# Patient Record
Sex: Female | Born: 1995 | Race: White | Hispanic: No | Marital: Single | Smoking: Never smoker
Health system: Southern US, Community
[De-identification: ages and names within clinical notes are randomized; demographics above are authoritative.]

## PROBLEM LIST (undated history)

## (undated) DIAGNOSIS — M25851 Other specified joint disorders, right hip: Secondary | ICD-10-CM

## (undated) DIAGNOSIS — M25551 Pain in right hip: Secondary | ICD-10-CM

## (undated) DIAGNOSIS — S73191A Other sprain of right hip, initial encounter: Secondary | ICD-10-CM

## (undated) DIAGNOSIS — M24152 Other articular cartilage disorders, left hip: Secondary | ICD-10-CM

## (undated) DIAGNOSIS — M545 Low back pain, unspecified: Secondary | ICD-10-CM

## (undated) MED ORDER — BROMPHENIRAMINE-PSEUDOEPHEDRINE-DM 2 MG-30 MG-10 MG/5 ML SYRUP
2-30-10 mg/5 mL | Freq: Four times a day (QID) | ORAL | Status: AC | PRN
Start: ? — End: 2013-06-26

## (undated) MED ORDER — FLUTICASONE 50 MCG/ACTUATION NASAL SPRAY, SUSP
50 mcg/actuation | NASAL | Status: DC
Start: ? — End: 2014-12-27

---
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
## 2014-12-27 ENCOUNTER — Ambulatory Visit
Admit: 2014-12-27 | Discharge: 2014-12-27 | Payer: PRIVATE HEALTH INSURANCE | Attending: Specialist | Primary: Specialist

## 2014-12-27 DIAGNOSIS — Z0000 Encounter for general adult medical examination without abnormal findings: Secondary | ICD-10-CM

## 2014-12-27 NOTE — Other (Signed)
Patient verified name, DOB, and surgery as listed in [facility].    TYPE  CASE:3            Orders:none as of yet  Labs per surgeon:  Pending orders   Labs per anesthesia protocol: cbc, bmp-- pt having these drawn 12/29/14 at dr patels office--- results will be in [facility]--- will also need nasal swab--- pt coming 12/28/14 for this--- also will need hcg dos  EKG  :  none    Instructed patient to continue previous medications as prescribed prior to surgery and  to take the following medications the day of surgery according to anesthesia guidelines with a small sip of water : none       Continue all previous medications unless otherwise directed.     Instructed patient to hold  the following medications prior to surgery: none    Patient instructed on the following and verbalized understanding:  Arrive at opc entrance, time of arrival to be called the day before by 1700.  Responsible adult must drive patient to and from hospital, stay during surgery and 24 hours postoperatively.  Npo after midnight including gum, mints and ice chips.  Use hibiclens in the shower the night before and the morning of surgery.OF NOTE---- SURG OVER 4 HOURS---- listed as phone assess--- spoke with dr crumpler--ok to see pt dos  Leave all valuables at home. Instructed on no jewelry or body piercings on the dos.  Bring insurance card and ID.  No perfumes, oil, powder, colognes, makeup or  lotions on the skin.    Patient verbalized understanding of all instructions and provided all medical/health information to the best of their ability.

## 2014-12-27 NOTE — Progress Notes (Signed)
[facility]  Liisa Picone C. Posey Pronto, M.D.  Campbell Riches, M.D.  [place], [place]  Ph No:  [phone]  Fax:  [phone]      CHIEF COMPLAINT: complete physical       History of Present Illness: Ms. Silvernail is a 19 y.o. female that presents today with PMH of   Pertinent past medical history she is in office today stating that she is here for complete physical.    No Known Allergies    Past Medical History   Diagnosis Date   ??? Left hip pain        History reviewed. No pertinent past surgical history.    Family History   Problem Relation Age of Onset   ??? Elevated Lipids Father    ??? Hypertension Mother        History     Social History   ??? Marital Status: SINGLE     Spouse Name: N/A   ??? Number of Children: N/A   ??? Years of Education: N/A     Occupational History   ??? Not on file.     Social History Main Topics   ??? Smoking status: Never Smoker    ??? Smokeless tobacco: Never Used   ??? Alcohol Use: No   ??? Drug Use: No   ??? Sexual Activity: Not on file     Other Topics Concern   ??? Not on file     Social History Narrative       Current Outpatient Prescriptions   Medication Sig Dispense Refill   ??? norethindrone ac-eth estradiol (LOESTRIN 1.5/30, 21,) 1.5-30 mg-mcg tab Take 1 Tab by mouth nightly.         IMMUNIZATIONS:  Immunization History   Administered Date(s) Administered   ??? Tdap 12/27/2014         REVIEW OF SYSTEMS:    GENERAL/CONSTITUTIONAL: Negative for  - chills, fatigue, fever, night sweats, sleep disturbance, weight gain, weight loss  HEAD, EYES, EARS, NOSE AND THROAT: Negative for - epistaxis, headaches, hearing change, nasal congestion, nasal discharge, oral lesions, sinus pain, sneezing, sore throat, tinnitus, vertigo, visual changes, vocal changes  CARDIOVASCULAR: Negative for - chest pain, edema, irregular heartbeat, loss of consciousness, orthopnea, palpitations, paroxysmal nocturnal dyspnea, rapid heart rate, shortness of breath on exertion.   RESPIRATORY:  Negative for - cough, hemoptysis, orthopnea, pleuritic pain, shortness of breath, sputum changes,  tachypnea, wheezing  GASTROINTESTINAL: Negative for - abdominal pain, appetite loss, blood in stools, change in bowel habits, change in stools, constipation, diarrhea, gas/bloating, heartburn, hematemesis, melena, nausea/vomiting, stool incontinence, swallowing difficulty/pain  GENITOURINARY: Negative for - Urinary frequency  MUSCULOSKELETAL: Negative for - gait disturbance, joint pain, joint stiffness, joint swelling, muscle pain and muscular weakness  SKIN:  Negative for - acne, dry skin, eczema, hair changes, lumps, mole changes, nail changes, pruritus, rash, skin lesion changes  NEUROLOGIC: Negative for - behavioral changes, bowel and bladder control changes, confusion, dizziness, gait disturbance, headaches, impaired coordination/balance, memory loss, numbness/tingling, seizures, speech problems, tremors, visual changes, weakness  PSYCHIATRIC: Negative for - anxiety, behavioral disorder, concentration difficulties, depression, disorientation, hallucinations, irritability,  mood swings, obsessive thoughts,  sleep disturbances, suicidal ideation  ENDOCRINE:  Negative for - breast changes, galactorrhea, hair pattern changes, hot flashes, malaise/lethargy, mood swings, palpitations, polydipsia/polyuria, skin changes, temperature intolerance, unexpected weight changes  HEMATOLOGIC/LYMPHATIC:  Negative for - bleeding problems, blood clots, blood transfusions, bruising, fatigue, jaundice, night sweats, pallor, swollen lymph  nodes, weight loss   ALLERGIC/IMMUNOLOGIC:  Negative for - hives, insect bite sensitivity, itchy/watery eyes, nasal congestion, postnasal drip, seasonal allergies        PHYSICAL EXAM   Vital Signs - BP 112/74 mmHg   Pulse 62   Ht '5\' 7"'  (1.702 m)   Wt 133 lb (60.328 kg)   BMI 20.83 kg/m2   Constitutional - alert, well appearing, and in no distress.   Eyes - pupils equal and reactive, extraocular eye movements intact.  Ear, Nose, Mouth, Throat - external inspection of ears and nose normal   Neck - supple, no significant adenopathy.   Respiratory - clear to auscultation, no wheezes, rales or bronchi, symmetric air entry.  Cardiovascular - normal rate, regular rhythm, normal S1, S2  no murmurs, rubs, clicks or gallops.  Gastrointestinal - Abdomen soft, nontender, nondistended, no masses.  Back exam -  Normal range of motion.  Musculoskeletal - no joint tenderness  Skin - normal coloration and turgor, no rashes, no suspicious skin lesions noted.  Neurological -  Motor and sensory exam is intact .   Extremities - nno edema  Psychiatric - alert, oriented to person, place, and time, recent and remote memory, mood and affect       LABS:     Results for orders placed or performed in visit on 04/19/24   METABOLIC PANEL, COMPREHENSIVE   Result Value Ref Range    Glucose 73 65 - 99 mg/dL    BUN 8 6 - 20 mg/dL    Creatinine 0.70 0.57 - 1.00 mg/dL    GFR est non-AA 127 >59 mL/min/1.73    GFR est AA 146 >59 mL/min/1.73    BUN/Creatinine ratio 11 8 - 20    Sodium 141 134 - 144 mmol/L    Potassium 4.2 3.5 - 5.2 mmol/L    Chloride 100 97 - 108 mmol/L    CO2 24 18 - 29 mmol/L    Calcium 9.8 8.7 - 10.2 mg/dL    Protein, total 6.8 6.0 - 8.5 g/dL    Albumin 4.5 3.5 - 5.5 g/dL    GLOBULIN, TOTAL 2.3 1.5 - 4.5 g/dL    A-G Ratio 2.0 1.1 - 2.5    Bilirubin, total 0.2 0.0 - 1.2 mg/dL    Alk. phosphatase 89 43 - 101 IU/L    AST 31 0 - 40 IU/L    ALT 30 0 - 32 IU/L   INFECTIOUS MONONUCLEOSIS, QL   Result Value Ref Range    Mononucleosis Test, Ql. Negative Negative   AMB POC COMPLETE CBC,AUTOMATED ENTER   Result Value Ref Range    WBC (POC) 6.5 4.5 - 10.5 10^3/ul    LYMPHOCYTES (POC) 25.7 20.5 - 51.1 %    MONOCYTES (POC) 7.7 1.7 - 9.3 %    GRANULOCYTES (POC) 66.6 42.2 - 75.2 %    ABS. LYMPHS (POC) 1.7 1.2 - 3.4 10^3/ul    ABS. MONOS (POC) 0.5 0.1 - 0.6 10^3/ul     ABS. GRANS (POC) 4.3 1.4 - 6.5 10^3/ul    RBC (POC) 4.81 4 - 6 10^6/ul    HGB (POC) 13.8 11 - 18 g/dL    HCT (POC) 41.7 35 - 60 %    MCV (POC) 86.6 80 - 99.9 fL    MCH (POC) 28.8 27 - 31 pg    MCHC (POC) 33.2 33 - 37 g/dL    RDW (POC) 13.2 11.6 - 13.7 %    PLATELET (POC) 211 150 -  450 10^3/ul    MPV (POC) 7.3 (A) 7.8 - 11 fL   AMB POC RAPID STREP A   Result Value Ref Range    VALID INTERNAL CONTROL POC Yes     Group A Strep Ag Negative Negative       IMPRESSION:     ICD-10-CM ICD-9-CM    1. Physical exam, annual Z00.00 V70.0 AMB POC COMPLETE CBC,AUTOMATED ENTER      AMB POC URINALYSIS DIP STICK AUTO W/O MICRO (CIM)      COLLECTION VENOUS BLOOD,VENIPUNCTURE      AMB POC HEMOGLOBIN A1C      LIPID PANEL      METABOLIC PANEL, COMPREHENSIVE      TSH 3RD GENERATION      VITAMIN D, 1, 25 DIHYDROXY   2. Need for Tdap vaccination Z23 V06.1 TETANUS, DIPHTHERIA TOXOIDS AND ACELLULAR PERTUSSIS VACCINE (TDAP), IN INDIVIDS. >=7, IM      PR IMMUNIZ ADMIN,1 SINGLE/COMB VAC/TOXOID        PLAN: I discussed with patient and reviewed the findings.  She is given Tdp Booster.  She is advised to blood drawn today.  She is advised to visit diet.  Continue exercise program.  She had her GYN checkup completed. Follow-up near future.                 Zenovia Jarred, MD    Dictated using voice recognition software. Proofread, but unrecognized voice recognition errors may exist.

## 2014-12-28 ENCOUNTER — Inpatient Hospital Stay: Admit: 2014-12-28 | Payer: PRIVATE HEALTH INSURANCE | Primary: Specialist

## 2014-12-28 ENCOUNTER — Inpatient Hospital Stay: Primary: Specialist

## 2014-12-28 DIAGNOSIS — Z01812 Encounter for preprocedural laboratory examination: Secondary | ICD-10-CM

## 2014-12-28 LAB — MSSA/MRSA SC BY PCR, NASAL SWAB

## 2014-12-28 NOTE — Other (Signed)
Patient here for nasal swab, patient to have lab work with Dr Allena KatzPatel tomorrow.

## 2014-12-28 NOTE — Other (Signed)
MRSA/SA nasal swab results are both are negative.

## 2014-12-28 NOTE — Other (Signed)
Received faxed surgeon orders.  Faxed to pharmacy and placed on chart.

## 2014-12-29 ENCOUNTER — Encounter: Admit: 2014-12-29 | Discharge: 2014-12-29 | Payer: PRIVATE HEALTH INSURANCE | Primary: Specialist

## 2014-12-29 DIAGNOSIS — Z0000 Encounter for general adult medical examination without abnormal findings: Secondary | ICD-10-CM

## 2014-12-29 LAB — AMB POC COMPLETE CBC,AUTOMATED ENTER
ABS. GRANS (POC): 7.7 10*3/uL — AB (ref 1.4–6.5)
ABS. LYMPHS (POC): 1.2 10*3/uL (ref 1.2–3.4)
ABS. MONOS (POC): 0.4 10*3/uL (ref 0.1–0.6)
GRANULOCYTES (POC): 82.9 % — AB (ref 42.2–75.2)
HCT (POC): 43.8 % (ref 35–60)
HGB (POC): 14.2 g/dL (ref 11–18)
LYMPHOCYTES (POC): 13.1 % — AB (ref 20.5–51.1)
MCH (POC): 28.7 pg (ref 27–31)
MCHC (POC): 32.3 g/dL — AB (ref 33–37)
MCV (POC): 88.7 fL (ref 80–99.9)
MONOCYTES (POC): 4 % (ref 1.7–9.3)
MPV (POC): 7.9 fL (ref 7.8–11)
PLATELET (POC): 166 10*3/uL (ref 150–450)
RBC (POC): 4.94 10*6/uL (ref 4–6)
RDW (POC): 13.2 % (ref 11.6–13.7)
WBC (POC): 9.3 10*3/uL (ref 4.5–10.5)

## 2014-12-29 LAB — AMB POC URINALYSIS DIP STICK AUTO W/O MICRO
Bilirubin (UA POC): NEGATIVE
Blood (UA POC): NEGATIVE
Glucose (UA POC): NEGATIVE mg/dL
Ketones (UA POC): NEGATIVE
Nitrites (UA POC): NEGATIVE
Protein (UA POC): NEGATIVE
Specific gravity (UA POC): 1.025 (ref 1.001–1.035)
Urobilinogen (UA POC): 0.2 (ref 0.2–1)
pH (UA POC): 5.5 (ref 4.6–8.0)

## 2014-12-29 LAB — AMB POC HEMOGLOBIN A1C: Hemoglobin A1c (POC): 5.3 % (ref 4.6–6.2)

## 2014-12-30 LAB — METABOLIC PANEL, COMPREHENSIVE
A-G Ratio: 2.1 (ref 1.1–2.5)
ALT (SGPT): 21 IU/L (ref 0–32)
AST (SGOT): 21 IU/L (ref 0–40)
Albumin: 4.7 g/dL (ref 3.5–5.5)
Alk. phosphatase: 74 IU/L (ref 39–117)
BUN/Creatinine ratio: 13 (ref 8–20)
BUN: 12 mg/dL (ref 6–20)
Bilirubin, total: 0.4 mg/dL (ref 0.0–1.2)
CO2: 24 mmol/L (ref 18–29)
Calcium: 9.5 mg/dL (ref 8.7–10.2)
Chloride: 102 mmol/L (ref 97–108)
Creatinine: 0.89 mg/dL (ref 0.57–1.00)
GFR est AA: 108 mL/min/{1.73_m2} (ref >59)
GFR est non-AA: 94 mL/min/{1.73_m2} (ref >59)
GLOBULIN, TOTAL: 2.2 g/dL (ref 1.5–4.5)
Glucose: 79 mg/dL (ref 65–99)
Potassium: 4.6 mmol/L (ref 3.5–5.2)
Protein, total: 6.9 g/dL (ref 6.0–8.5)
Sodium: 141 mmol/L (ref 134–144)

## 2014-12-30 LAB — LIPID PANEL
Cholesterol, total: 207 mg/dL — ABNORMAL HIGH (ref 100–199)
HDL Cholesterol: 77 mg/dL (ref >39)
LDL, calculated: 116 mg/dL — ABNORMAL HIGH (ref 0–99)
Triglyceride: 68 mg/dL (ref 0–149)
VLDL, calculated: 14 mg/dL (ref 5–40)

## 2014-12-30 LAB — TSH 3RD GENERATION: TSH: 1.7 u[IU]/mL (ref 0.450–4.500)

## 2014-12-30 LAB — VITAMIN D, 1, 25 DIHYDROXY: Calcitriol (Vit D 1, 25 di-OH): 52.6 pg/mL (ref 19.9–79.3)

## 2014-12-30 NOTE — Other (Signed)
Reviewed CMP results  -- WNL.  Placed order for T&S, CBC for DOS (per protocol).

## 2015-01-02 ENCOUNTER — Inpatient Hospital Stay: Payer: PRIVATE HEALTH INSURANCE

## 2015-01-02 ENCOUNTER — Ambulatory Visit: Admit: 2015-01-02 | Payer: PRIVATE HEALTH INSURANCE | Primary: Specialist

## 2015-01-02 LAB — TYPE AND SCREEN
ABO/Rh: O POS
Antibody Screen: NEGATIVE

## 2015-01-02 LAB — HCG URINE, QL. - POC: Pregnancy test,urine (POC): NEGATIVE

## 2015-01-02 LAB — TYPE & SCREEN
ABO/Rh(D): O POS
Antibody screen: NEGATIVE

## 2015-01-02 MED ORDER — NALOXONE 0.4 MG/ML INJECTION
0.4 mg/mL | INTRAMUSCULAR | Status: DC | PRN
Start: 2015-01-02 — End: 2015-01-03

## 2015-01-02 MED ORDER — HYDROMORPHONE (PF) 2 MG/ML IJ SOLN
2 mg/mL | INTRAMUSCULAR | Status: DC | PRN
Start: 2015-01-02 — End: 2015-01-03
  Administered 2015-01-02 (×2): via INTRAVENOUS

## 2015-01-02 MED ORDER — MIDAZOLAM 1 MG/ML IJ SOLN
1 mg/mL | Freq: Once | INTRAMUSCULAR | Status: AC
Start: 2015-01-02 — End: 2015-01-02
  Administered 2015-01-02: 11:00:00 via INTRAVENOUS

## 2015-01-02 MED ORDER — EPINEPHRINE 1 MG/ML IJ SOLN
1 mg/mL | INTRAMUSCULAR | Status: AC
Start: 2015-01-02 — End: ?

## 2015-01-02 MED ORDER — PROPOFOL 10 MG/ML IV EMUL
10 mg/mL | INTRAVENOUS | Status: AC
Start: 2015-01-02 — End: ?

## 2015-01-02 MED ORDER — CEFAZOLIN 2 GRAM/50 ML NS IVPB
Freq: Once | INTRAVENOUS | Status: AC
Start: 2015-01-02 — End: 2015-01-02
  Administered 2015-01-02: 12:00:00 via INTRAVENOUS

## 2015-01-02 MED ORDER — LIDOCAINE (PF) 20 MG/ML (2 %) IJ SOLN
20 mg/mL (2 %) | INTRAMUSCULAR | Status: DC | PRN
Start: 2015-01-02 — End: 2015-01-02
  Administered 2015-01-02: 12:00:00 via INTRAVENOUS

## 2015-01-02 MED ORDER — CEFAZOLIN 2 GRAM/50 ML NS IVPB
Freq: Three times a day (TID) | INTRAVENOUS | Status: AC
Start: 2015-01-02 — End: 2015-01-02
  Administered 2015-01-02 – 2015-01-03 (×2): via INTRAVENOUS

## 2015-01-02 MED ORDER — BUPIVACAINE-EPINEPHRINE (PF) 0.25 %-1:200,000 IJ SOLN
0.25 %-1:200,000 | INTRAMUSCULAR | Status: DC | PRN
Start: 2015-01-02 — End: 2015-01-02
  Administered 2015-01-02: 11:00:00 via PERINEURAL

## 2015-01-02 MED ORDER — ACETAMINOPHEN 325 MG TABLET
325 mg | Freq: Three times a day (TID) | ORAL | Status: DC
Start: 2015-01-02 — End: 2015-01-03
  Administered 2015-01-02 – 2015-01-03 (×3): via ORAL

## 2015-01-02 MED ORDER — LACTATED RINGERS IV
INTRAVENOUS | Status: DC
Start: 2015-01-02 — End: 2015-01-03
  Administered 2015-01-02: 17:00:00 via INTRAVENOUS

## 2015-01-02 MED ORDER — ROCURONIUM 10 MG/ML IV
10 mg/mL | INTRAVENOUS | Status: AC
Start: 2015-01-02 — End: ?

## 2015-01-02 MED ORDER — LACTATED RINGERS IV
INTRAVENOUS | Status: DC
Start: 2015-01-02 — End: 2015-01-03
  Administered 2015-01-02: 18:00:00 via INTRAVENOUS

## 2015-01-02 MED ORDER — ROPIVACAINE (PF) 5 MG/ML (0.5 %) INJECTION
5 mg/mL (0. %) | INTRAMUSCULAR | Status: AC
Start: 2015-01-02 — End: ?

## 2015-01-02 MED ORDER — ONDANSETRON (PF) 4 MG/2 ML INJECTION
4 mg/2 mL | INTRAMUSCULAR | Status: DC | PRN
Start: 2015-01-02 — End: 2015-01-02
  Administered 2015-01-02: 14:00:00 via INTRAVENOUS

## 2015-01-02 MED ORDER — SODIUM CHLORIDE 0.9 % IJ SYRG
Freq: Three times a day (TID) | INTRAMUSCULAR | Status: DC
Start: 2015-01-02 — End: 2015-01-03
  Administered 2015-01-03: 02:00:00 via INTRAVENOUS

## 2015-01-02 MED ORDER — NEOSTIGMINE METHYLSULFATE 1 MG/ML INJECTION
1 mg/mL | INTRAMUSCULAR | Status: DC | PRN
Start: 2015-01-02 — End: 2015-01-02
  Administered 2015-01-02: 14:00:00 via INTRAVENOUS

## 2015-01-02 MED ORDER — LACTATED RINGERS IV
INTRAVENOUS | Status: DC
Start: 2015-01-02 — End: 2015-01-02
  Administered 2015-01-02 (×2): via INTRAVENOUS

## 2015-01-02 MED ORDER — FENTANYL CITRATE (PF) 50 MCG/ML IJ SOLN
50 mcg/mL | INTRAMUSCULAR | Status: DC | PRN
Start: 2015-01-02 — End: 2015-01-02
  Administered 2015-01-02 (×3): via INTRAVENOUS

## 2015-01-02 MED ORDER — NAPROXEN 250 MG TAB
250 mg | Freq: Two times a day (BID) | ORAL | Status: DC
Start: 2015-01-02 — End: 2015-01-03
  Administered 2015-01-03: 12:00:00 via ORAL

## 2015-01-02 MED ORDER — HYDROMORPHONE (PF) 1 MG/ML IJ SOLN
1 mg/mL | INTRAMUSCULAR | Status: DC | PRN
Start: 2015-01-02 — End: 2015-01-03

## 2015-01-02 MED ORDER — GLYCOPYRROLATE 0.2 MG/ML IJ SOLN
0.2 mg/mL | INTRAMUSCULAR | Status: DC | PRN
Start: 2015-01-02 — End: 2015-01-02
  Administered 2015-01-02: 14:00:00 via INTRAVENOUS

## 2015-01-02 MED ORDER — CELECOXIB 200 MG CAP
200 mg | Freq: Once | ORAL | Status: AC
Start: 2015-01-02 — End: 2015-01-02
  Administered 2015-01-02: 11:00:00 via ORAL

## 2015-01-02 MED ORDER — DIPHENHYDRAMINE HCL 50 MG/ML IJ SOLN
50 mg/mL | Freq: Once | INTRAMUSCULAR | Status: DC | PRN
Start: 2015-01-02 — End: 2015-01-03

## 2015-01-02 MED ORDER — OXYCODONE 5 MG TAB
5 mg | Freq: Once | ORAL | Status: DC | PRN
Start: 2015-01-02 — End: 2015-01-03
  Administered 2015-01-02 – 2015-01-03 (×2): via ORAL

## 2015-01-02 MED ORDER — PROPOFOL 10 MG/ML IV EMUL
10 mg/mL | INTRAVENOUS | Status: DC | PRN
Start: 2015-01-02 — End: 2015-01-02
  Administered 2015-01-02: 12:00:00 via INTRAVENOUS

## 2015-01-02 MED ORDER — ALBUTEROL SULFATE 0.083 % (0.83 MG/ML) SOLN FOR INHALATION
2.5 mg /3 mL (0.083 %) | RESPIRATORY_TRACT | Status: DC | PRN
Start: 2015-01-02 — End: 2015-01-03

## 2015-01-02 MED ORDER — MIDAZOLAM 1 MG/ML IJ SOLN
1 mg/mL | Freq: Once | INTRAMUSCULAR | Status: DC | PRN
Start: 2015-01-02 — End: 2015-01-02

## 2015-01-02 MED ORDER — ONDANSETRON (PF) 4 MG/2 ML INJECTION
4 mg/2 mL | INTRAMUSCULAR | Status: AC
Start: 2015-01-02 — End: ?

## 2015-01-02 MED ORDER — ALUM-MAG HYDROXIDE-SIMETH 200 MG-200 MG-20 MG/5 ML ORAL SUSP
200-200-20 mg/5 mL | ORAL | Status: DC | PRN
Start: 2015-01-02 — End: 2015-01-03

## 2015-01-02 MED ORDER — EPINEPHRINE (PF) 1 MG/ML INJECTION
1 mg/mL ( mL) | INTRAMUSCULAR | Status: DC | PRN
Start: 2015-01-02 — End: 2015-01-02
  Administered 2015-01-02: 11:00:00

## 2015-01-02 MED ORDER — OXYCODONE-ACETAMINOPHEN 7.5 MG-325 MG TAB
ORAL | Status: DC | PRN
Start: 2015-01-02 — End: 2015-01-03
  Administered 2015-01-02 – 2015-01-03 (×4): via ORAL

## 2015-01-02 MED ORDER — FENTANYL CITRATE (PF) 50 MCG/ML IJ SOLN
50 mcg/mL | INTRAMUSCULAR | Status: AC
Start: 2015-01-02 — End: ?

## 2015-01-02 MED ORDER — NEOSTIGMINE METHYLSULFATE 1 MG/ML INJECTION
1 mg/mL | INTRAMUSCULAR | Status: AC
Start: 2015-01-02 — End: ?

## 2015-01-02 MED ORDER — PREGABALIN 150 MG CAP
150 mg | Freq: Once | ORAL | Status: AC
Start: 2015-01-02 — End: 2015-01-02
  Administered 2015-01-02: 11:00:00 via ORAL

## 2015-01-02 MED ORDER — LIDOCAINE HCL 1 % (10 MG/ML) IJ SOLN
10 mg/mL (1 %) | INTRAMUSCULAR | Status: DC | PRN
Start: 2015-01-02 — End: 2015-01-02

## 2015-01-02 MED ORDER — APREPITANT 40 MG CAP
40 mg | Freq: Once | ORAL | Status: AC
Start: 2015-01-02 — End: 2015-01-02
  Administered 2015-01-02: 11:00:00 via ORAL

## 2015-01-02 MED ORDER — SODIUM CHLORIDE 0.9 % IJ SYRG
INTRAMUSCULAR | Status: DC | PRN
Start: 2015-01-02 — End: 2015-01-03

## 2015-01-02 MED ORDER — ONDANSETRON (PF) 4 MG/2 ML INJECTION
4 mg/2 mL | INTRAMUSCULAR | Status: DC | PRN
Start: 2015-01-02 — End: 2015-01-03
  Administered 2015-01-02 – 2015-01-03 (×6): via INTRAVENOUS

## 2015-01-02 MED ORDER — GLYCOPYRROLATE 0.2 MG/ML IJ SOLN
0.2 mg/mL | INTRAMUSCULAR | Status: AC
Start: 2015-01-02 — End: ?

## 2015-01-02 MED ORDER — LIDOCAINE (PF) 20 MG/ML (2 %) IJ SOLN
20 mg/mL (2 %) | INTRAMUSCULAR | Status: AC
Start: 2015-01-02 — End: ?

## 2015-01-02 MED ORDER — ONDANSETRON (PF) 4 MG/2 ML INJECTION
4 mg/2 mL | Freq: Once | INTRAMUSCULAR | Status: AC
Start: 2015-01-02 — End: 2015-01-02

## 2015-01-02 MED ORDER — DOCUSATE SODIUM 100 MG CAP
100 mg | Freq: Two times a day (BID) | ORAL | Status: DC
Start: 2015-01-02 — End: 2015-01-03
  Administered 2015-01-02 – 2015-01-03 (×2): via ORAL

## 2015-01-02 MED ORDER — DEXAMETHASONE SODIUM PHOSPHATE 4 MG/ML IJ SOLN
4 mg/mL | INTRAMUSCULAR | Status: AC
Start: 2015-01-02 — End: ?

## 2015-01-02 MED ORDER — NORETHINDRONE ACETATE 1.5 MG-ETHINYL ESTRADIOL 30 MCG TABLET
Freq: Every day | ORAL | Status: DC
Start: 2015-01-02 — End: 2015-01-03

## 2015-01-02 MED ORDER — TAPENTADOL 50 MG TAB
50 mg | Freq: Once | ORAL | Status: AC
Start: 2015-01-02 — End: 2015-01-02
  Administered 2015-01-02: 11:00:00 via ORAL

## 2015-01-02 MED ORDER — ROPIVACAINE (PF) 5 MG/ML (0.5 %) INJECTION
5 mg/mL (0. %) | INTRAMUSCULAR | Status: DC | PRN
Start: 2015-01-02 — End: 2015-01-02
  Administered 2015-01-02: 14:00:00

## 2015-01-02 MED ORDER — DEXAMETHASONE SODIUM PHOSPHATE 4 MG/ML IJ SOLN
4 mg/mL | INTRAMUSCULAR | Status: DC | PRN
Start: 2015-01-02 — End: 2015-01-02
  Administered 2015-01-02: 12:00:00 via INTRAVENOUS

## 2015-01-02 MED ORDER — ROCURONIUM 10 MG/ML IV
10 mg/mL | INTRAVENOUS | Status: DC | PRN
Start: 2015-01-02 — End: 2015-01-02
  Administered 2015-01-02 (×3): via INTRAVENOUS

## 2015-01-02 MED ORDER — BUPIVACAINE-EPINEPHRINE (PF) 0.5 %-1:200,000 IJ SOLN
0.5 %-1:200,000 | INTRAMUSCULAR | Status: DC | PRN
Start: 2015-01-02 — End: 2015-01-02
  Administered 2015-01-02: 11:00:00 via PERINEURAL

## 2015-01-02 MED ORDER — KETOROLAC TROMETHAMINE 30 MG/ML INJECTION
30 mg/mL (1 mL) | INTRAMUSCULAR | Status: AC
Start: 2015-01-02 — End: 2015-01-02
  Administered 2015-01-02: 15:00:00 via INTRAVENOUS

## 2015-01-02 MED FILL — ROCURONIUM 10 MG/ML IV: 10 mg/mL | INTRAVENOUS | Qty: 5

## 2015-01-02 MED FILL — GLYCOPYRROLATE 0.2 MG/ML IJ SOLN: 0.2 mg/mL | INTRAMUSCULAR | Qty: 0.4

## 2015-01-02 MED FILL — DEXAMETHASONE SODIUM PHOSPHATE 4 MG/ML IJ SOLN: 4 mg/mL | INTRAMUSCULAR | Qty: 4

## 2015-01-02 MED FILL — TYLENOL 325 MG TABLET: 325 mg | ORAL | Qty: 2

## 2015-01-02 MED FILL — DOCUSATE SODIUM 100 MG CAP: 100 mg | ORAL | Qty: 1

## 2015-01-02 MED FILL — GLYCOPYRROLATE 0.2 MG/ML IJ SOLN: 0.2 mg/mL | INTRAMUSCULAR | Qty: 2

## 2015-01-02 MED FILL — CEFAZOLIN 2 GRAM/50 ML NS IVPB: INTRAVENOUS | Qty: 50

## 2015-01-02 MED FILL — EMEND 40 MG CAPSULE: 40 mg | ORAL | Qty: 1

## 2015-01-02 MED FILL — OXYCODONE 5 MG TAB: 5 mg | ORAL | Qty: 2

## 2015-01-02 MED FILL — ONDANSETRON (PF) 4 MG/2 ML INJECTION: 4 mg/2 mL | INTRAMUSCULAR | Qty: 2

## 2015-01-02 MED FILL — NUCYNTA 50 MG TABLET: 50 mg | ORAL | Qty: 2

## 2015-01-02 MED FILL — ROCURONIUM 10 MG/ML IV: 10 mg/mL | INTRAVENOUS | Qty: 65

## 2015-01-02 MED FILL — NEOSTIGMINE METHYLSULFATE 1 MG/ML INJECTION: 1 mg/mL | INTRAMUSCULAR | Qty: 3

## 2015-01-02 MED FILL — LACTATED RINGERS IV: INTRAVENOUS | Qty: 1000

## 2015-01-02 MED FILL — ONDANSETRON (PF) 4 MG/2 ML INJECTION: 4 mg/2 mL | INTRAMUSCULAR | Qty: 4

## 2015-01-02 MED FILL — LIDOCAINE (PF) 20 MG/ML (2 %) IJ SOLN: 20 mg/mL (2 %) | INTRAMUSCULAR | Qty: 100

## 2015-01-02 MED FILL — KETOROLAC TROMETHAMINE 30 MG/ML INJECTION: 30 mg/mL (1 mL) | INTRAMUSCULAR | Qty: 1

## 2015-01-02 MED FILL — NEOSTIGMINE METHYLSULFATE 1 MG/ML INJECTION: 1 mg/mL | INTRAMUSCULAR | Qty: 10

## 2015-01-02 MED FILL — MIDAZOLAM 1 MG/ML IJ SOLN: 1 mg/mL | INTRAMUSCULAR | Qty: 2

## 2015-01-02 MED FILL — NAROPIN (PF) 5 MG/ML (0.5 %) INJECTION SOLUTION: 5 mg/mL (0. %) | INTRAMUSCULAR | Qty: 30

## 2015-01-02 MED FILL — SENSORCAINE-MPF/EPINEPHRINE 0.5 %-1:200,000 INJECTION SOLUTION: 0.5 %-1:200,000 | INTRAMUSCULAR | Qty: 20

## 2015-01-02 MED FILL — LYRICA 150 MG CAPSULE: 150 mg | ORAL | Qty: 1

## 2015-01-02 MED FILL — HYDROMORPHONE (PF) 2 MG/ML IJ SOLN: 2 mg/mL | INTRAMUSCULAR | Qty: 1

## 2015-01-02 MED FILL — PROPOFOL 10 MG/ML IV EMUL: 10 mg/mL | INTRAVENOUS | Qty: 150

## 2015-01-02 MED FILL — EPINEPHRINE 1 MG/ML IJ SOLN: 1 mg/mL | INTRAMUSCULAR | Qty: 30

## 2015-01-02 MED FILL — CELECOXIB 200 MG CAP: 200 mg | ORAL | Qty: 2

## 2015-01-02 MED FILL — FENTANYL CITRATE (PF) 50 MCG/ML IJ SOLN: 50 mcg/mL | INTRAMUSCULAR | Qty: 2

## 2015-01-02 MED FILL — DEXAMETHASONE SODIUM PHOSPHATE 4 MG/ML IJ SOLN: 4 mg/mL | INTRAMUSCULAR | Qty: 1

## 2015-01-02 MED FILL — LIDOCAINE (PF) 20 MG/ML (2 %) IJ SOLN: 20 mg/mL (2 %) | INTRAMUSCULAR | Qty: 5

## 2015-01-02 MED FILL — OXYCODONE-ACETAMINOPHEN 7.5 MG-325 MG TAB: ORAL | Qty: 1

## 2015-01-02 MED FILL — PROPOFOL 10 MG/ML IV EMUL: 10 mg/mL | INTRAVENOUS | Qty: 20

## 2015-01-02 MED FILL — SENSORCAINE-MPF/EPINEPHRINE 0.25 %-1:200,000 INJECTION SOLUTION: 0.25 %-1:200,000 | INTRAMUSCULAR | Qty: 30

## 2015-01-02 NOTE — Progress Notes (Addendum)
Problem: Mobility Impaired (Adult and Pediatric)  Goal: *Acute Goals and Plan of Care (Insert Text)  Acute care goals:  (1.)Brooke Graham will move from supine to sit and sit to supine with STAND BY ASSIST within 3 day(s).   (2.)Brooke Graham will transfer from bed to chair and chair to bed with CONTACT GUARD ASSIST using the least restrictive device within 3 day(s).   (3.)Brooke Graham will ambulate with CONTACT GUARD ASSIST for 50 feet while maintaining TDWB LLE status with the least restrictive device within 3 day(s).   (4.)Brooke Graham will verbalize and demonstrate compliance with TDWB LLE with ZERO verbal cues within 3 day(s).  ________________________________________________________________________________________________  PHYSICAL THERAPY: INITIAL ASSESSMENT, TREATMENT DAY: DAY OF ASSESSMENT AND PM  OBSERVATION: [employer] : Hospital Day: 1     TDWB LLE  ROM restrictions: limit PROM hip flexion 70 degrees (can sit EOB hip flexion 90 degrees), limit hip abduction 20 degrees    NAME/AGE/GENDER: Brooke Graham is a 19 y.o. 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 Patient seen this PM for initial physical therapy evaluation: presents supine in bed and endorses 0/10 pain at rest, mother at bedside. Patient lives her parents in a 2 story residence with 3 steps to enter. At baseline, patient is independent with ADLs, ambulates without  assistance, drives, and plays collegiate tennis. Brooke Graham was educated on her postoperative hip precautions including: TDWB status, ROM limitations, and use of adaptive equipment. Today, LLE strength and ROM decreased but functional, and sensation decreased LLE. Patient performed bed mobility with minimal assistance for LLE, transfers with minimal assistance, and ambulation with crutches and min assist x20'. Brooke Graham ambulates to bedside commode demonstrating a step to gait pattern and fair dynamic balance. Brooke Graham presents with decreased functional mobility and balance/gait status from baseline. Recommend continued skilled PT services to address stated deficits. Will follow and progress toward stated goals during acute stay. Plan to address hip pendulum exercises at next treatment session.      ????????This section established at most recent assessment??????????  PROBLEM LIST (Impairments causing functional limitations):  1. Decreased Ambulation Ability/Technique  2. Decreased Balance  3. Decreased Flexibility/Joint Mobility  REHABILITATION POTENTIAL FOR STATED GOALS: EXCELLENT      PLAN OF CARE:   INTERVENTIONS PLANNED: (Benefits and precautions of physical therapy have been discussed with the patient.)  1. balance exercise  2. bed mobility  3. family education  4. gait training  5. range of motion: active/assisted/passive  6. therapeutic activities  7. therapeutic exercise/strengthening  8. transfer training  FREQUENCY/DURATION: Follow patient BID until goals are met in order to address above goals.    RECOMMENDED REHABILITATION/EQUIPMENT: (at time of discharge pending progress):   Outpatient: Physical Therapy.  SUBJECTIVE:   "My leg feels weird"    Present Symptoms: Patient has no c/o pain at rest.   Pain Intensity 1: 0     History of Present Injury/Illness: Per chart:      Review of Systems / Medical History  Patient  summary reviewed, nursing notes reviewed and pertinent labs reviewed      Pulmonary     Neuro/Psych      Cardiovascular  Exercise tolerance: >4 METS      GI/Hepatic/Renal     PUD    Endo/Other    Other Findings                 Prior Level of Function/Home Situation: Patient lives with her parents currently but attends [employer] and plays collegiate tennis. Lives in 2 story home at parent's house with 3 steps to enter.   Home Environment: Private residence  # Steps to Enter: 3  One/Two Story Residence: Two story  # of Interior Steps: 15  Living Alone: No  Support Systems: Parent  Patient Expects to be Discharged to:: Private residence  Current DME Used/Available at Home: Crutches  Tub or Shower Type: Multimedia programmer (built in chair)  OBJECTIVE/TREATMENT:   (In addition to Assessment/Re-Assessment sessions the following treatments were rendered)                                      [employer]??? ???6 Clicks???                                          Basic Mobility Inpatient Short Form  How much difficulty does the patient currently have... Unable A Lot A Little None   1.  Turning over in bed (including adjusting bedclothes, sheets and blankets)?   '[ ]'  1   '[ ]'  2   '[X]'  3   '[ ]'  4   2.  Sitting down on and standing up from a chair with arms ( e.g., wheelchair, bedside commode, etc.)   '[ ]'  1   '[ ]'  2   '[X]'  3   '[ ]'  4   3.  Moving from lying on back to sitting on the side of the bed?   '[ ]'  1   '[ ]'  2   '[X]'  3   '[ ]'  4               How much help from another person does the patient currently need... Total A Lot A Little None   4.  Moving to and from a bed to a chair (including a wheelchair)?   '[ ]'  1   '[ ]'  2   '[X]'  3   '[ ]'  4   5.  Need to walk in hospital room?   '[ ]'  1   '[ ]'  2   '[X]'  3   '[ ]'  4   6.  Climbing 3-5 steps with a railing?   '[ ]'  1   '[X]'  2   '[ ]'  3   '[ ]'  4   ?? 2007, Trustees of [place], under license to [employer]. All rights reserved       Score:  Initial: 17 Most Recent: 17 (Date: 01/02/15 )   Interpretation of Tool:  Represents activities that are increasingly more  difficult (i.e. Bed mobility, Transfers, Gait).  Score 24 23 22-20 19-15 14-10 9-7 6   Modifier CH CI CJ CK CL CM CN       ?? Mobility - Walking and Moving Around:  [id number] - CURRENT STATUS:       CK - 40%-59% impaired, limited or restricted              G8979 - GOAL STATUS:               CJ - 20%-39% impaired, limited or restricted              L2751 - D/C STATUS:                    ---------------To be determined---------------  Payor: [employer]ETNA / Plan: [employer] [place] [employer]OYEE PLAN / Product Type: PPO /    Most Recent Physical Functioning:   Gross Assessment:  AROM: Generally decreased, functional (LLE)  Strength: Generally decreased, functional (LLE)  Sensation: Impaired (LLE)               Posture:     Balance:  Sitting: Intact  Standing: Impaired  Standing - Static: Fair  Standing - Dynamic : Fair  Bed Mobility:  Supine to Sit: Minimum assistance (LLE)  Scooting: Minimum assistance  Wheelchair Mobility:     Transfers:  Sit to Stand: Minimum assistance  Stand to Sit: Minimum assistance  Gait:  Left Side Weight Bearing: Toe touch  Speed/Cadence: Slow  Step Length: Right shortened  Gait Abnormalities: Step to gait  Distance (ft): 20 Feet (ft)  Assistive Device: Crutches  Ambulation - Level of Assistance: Minimal assistance  Interventions: Verbal cues (negotiating crutches)      Initial Evaluation   Gait Training (  10 minutes):  Gait training to improve and/or restore physical functioning as related to mobility and balance.  Ambulated 20 Feet (ft) with Minimal assistance using a Crutches and minimal Verbal cues (negotiating crutches) related to their LLE TDWB status to maintain proper body mechanics.    Braces/Orthotics/Lines/Etc:   ?? O2 Device: Room air  Safety:   After treatment position/precautions:  ?? Up in chair  ?? Bed/Chair-wheels locked  ?? Bed in low position  ?? Caregiver at bedside  ?? Call light within reach  ?? RN notified  ?? Family at bedside  Progression/Medical Necessity:    ?? Patient demonstrates excellent rehab potential due to higher previous functional level.  Compliance with Program/Exercises: Will assess as treatment progresses.   Reason for Continuation of Services/Other Comments:  ?? Patient continues to require skilled intervention due to decreased functional mobility from baseline.  Recommendations/Intent for next treatment session: Treatment next visit will focus on advancements to more challenging activities and reduction in assistance provided.  Total Treatment Duration:  Time In: 1423  Time Out: [address]

## 2015-01-02 NOTE — Anesthesia Post-Procedure Evaluation (Signed)
Post-Anesthesia Evaluation and Assessment    Patient: Brooke StallingOlivia Legaspi MRN: [id number]  SSN: [id number]    Date of Birth: Mar 04, 1996  Age: 19 y.o.  Sex: female       Cardiovascular Function/Vital Signs  Visit Vitals   Item Reading   ??? BP 117/65 mmHg   ??? Pulse 56   ??? Temp 36.3 ??C (97.4 ??F)   ??? Resp 16   ??? Ht 5\' 7"  (1.702 m)   ??? Wt 60.328 kg (133 lb)   ??? BMI 20.83 kg/m2   ??? SpO2 100%       Patient is status post general anesthesia for Procedure(s):  LEFT HIP ARTHROSCOPY FEMOROPLASTY / ACETABULOPLASTY WITH LABRAL REPAIR.    Nausea/Vomiting: None    Postoperative hydration reviewed and adequate.    Pain:  Pain Scale 1: Numeric (0 - 10) (01/02/15 1121)  Pain Intensity 1: 5 (01/02/15 1121)   Managed    Neurological Status:   Neuro (WDL): Exceptions to WDL (01/02/15 1039)  Neuro  Neurologic State: Drowsy (01/02/15 1039)   At baseline    Mental Status and Level of Consciousness: Alert and oriented     Pulmonary Status:   O2 Device: Nasal cannula (01/02/15 1039)   Adequate oxygenation and airway patent    Complications related to anesthesia: None    Post-anesthesia assessment completed. No concerns    Signed By: Waymond CeraJOHN D Diala Waxman, MD     January 02, 2015

## 2015-01-02 NOTE — Anesthesia Procedure Notes (Signed)
Peripheral Block    Start time: 01/02/2015 6:47 AM  End time: 01/02/2015 6:51 AM  Block Type: left fascia iliaca  Reason for block: at surgeon's request and post-op pain management  Staffing  Anesthesiologist: Tia Gelb D  Performed by: anesthesiologist   Prep  Risks and benefits discussed with the patient and plans are to proceed  Site marked, Timeout performed, 06:47  Monitoring: standard ASA monitoring, continuous pulse ox, heart rate, responsive to questions, oxygen and frequent vital sign checks  Injection Technique: single-shot  Procedures: ultrasound guided  Prep Solution(s): chlorhexidine  Needle  Needle: 22G Stimuplex (Echogenic)  Needle localization: ultrasound guidance    Medication Injected: 30mL 0.25% bupivacaine with epi 1:200K  Additional Medication Injected: 20mL 0.5% bupivacaine with epi 1:200K  Assessment  Injection Assessment: incremental injection every 5 mL, local visualized surrounding nerve on ultrasound, negative aspiration for blood, no paresthesia, no intravascular symptoms and ultrasound image on chart  Patient tolerated without any apparent complications  Additional Notes  Ultrasound Block Procedure Note    Block placed for post op pain at surgeon's request.  Risks, benefits, and alternatives of left fascia iliaca nerve block discussed with patient and/or family.  Patient elects to proceed.  Standard monitors, oxygen per nasal cannula, and appropriate sedation utilized.  Sterile prep with chlorhexidine.  Ultrasound used to identify anatomy of nerve bundle.  22 gauge needle placement and local injection at perineural area confirmed with ultrasound guidance.  Negative aspiration and no paresthesia.  30 ml 0.25% Marcaine with 1:200,000 epi and 20 ml 0.5% Marcaine with epi injected in 5 ml increments with negative aspiration and absence of intravascular symptoms. Image placed on chart.  Patient tolerated procedure well.  No apparent complications.

## 2015-01-02 NOTE — Progress Notes (Signed)
Problem: Self Care Deficits Care Plan (Adult)  Goal: *Acute Goals and Plan of Care (Insert Text)  1. Patient will maintain TDWB status in LLE for 100% of treatment session and no verbal cues from therapist.   2. Patient will complete functional transfers with modified independence while maintaining TDWB status in LLE and with adaptive equipment as needed.   3. Patient will complete lower body bathing and dressing with modified independence and adaptive equipment as needed.   4. Patient will complete toileting and toilet transfer with modified independence.   5. Patient will tolerate 30 minutes of OT treatment with no rest breaks to increase activity tolerance for ADLs.   6. Patient will participate in at least 30 minutes of BUE therapeutic exercises to strengthen BUE for functional transfers.     Timeframe: 7 visits   OCCUPATIONAL THERAPY: INITIAL ASSESSMENT, DAILY NOTE AND TREATMENT DAY: 1ST  OBSERVATION: [employer] : Hospital Day: 1    NAME/AGE/GENDER: Brooke Graham is a 19 y.o. 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 Per MD orders, PROM as tolerated in hip flexion up to 70 degrees but may sit at 90 degrees & limit abduction to 20 degrees. Patient supine in bed upon arrival with mom at bedside. Reports no pain at rest. Patient  is typically independent with ADLs. 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 Patient educated on precautions. Able to complete bed mobility with minimal assistance. Patient participated in toileting and toilet transfer with minimal assistance. Addressed patient/ mom's questions regarding ADLs at home. Recommend 3 in 1 bedside commode for use at home due to hip restrictions. Patient is currently functioning below her baseline and would benefit from acute OT to increase independence and safety with ADLs while maintaining TDWB status. Patient has excellent rehab potential.       ????????This section established at most recent assessment??????????  PROBLEM LIST (Impairments causing functional limitations):  1. Decreased ADL/Functional Activities  2. Decreased Transfer Abilities  3. Decreased Ambulation Ability/Technique  4. Decreased Balance  5. Decreased Knowledge of precautions  REHABILITATION POTENTIAL FOR STATED GOALS: EXCELLENT  PLAN OF CARE:  INTERVENTIONS PLANNED: (Benefits and precautions of occupational therapy have been discussed with the patient.)  1. Activities of daily living training  2. Adaptive equipment training  3. Donning&doffing training  4. Theraputic activity  5. Theraputic exercise  FREQUENCY/DURATION: Follow patient 5 times/week until goals are met in order to address above goals.     RECOMMENDED REHABILITATION/EQUIPMENT: (at time of discharge pending progress):   3 in 1 bedside commode, outpatient PT. .  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 Independent and active at baseline.    Home Environment: Private residence  # Steps to Enter: 3  One/Two Story Residence: Two story   # of Interior Steps: 15  Living Alone: No  Support Systems: Parent  Patient Expects to be Discharged to:: Private residence  Current DME Used/Available at Home: Crutches  Tub or Shower Type: Multimedia programmer (built in chair)  OBJECTIVE/TREATMENT:   (In addition to Assessment/Re-Assessment sessions the following treatments were rendered)                                                  [facility]	[place] AM-PACTM "6 Clicks"                                                       Daily Activity Inpatient Short Form  How much help from another person does the patient currently need... Total A Lot A Little None   1.  Putting on and taking off regular lower body clothing?   '[ ]'  1   '[ ]'  2   '[X]'  3   '[ ]'  4   2.  Bathing (including washing, rinsing, drying)?   '[ ]'  1   '[ ]'  2   '[X]'  3   '[ ]'  4   3.  Toileting, which includes using toilet, bedpan or urinal?   '[ ]'  1   '[ ]'  2   '[X]'  3   '[ ]'  4   4.  Putting on and taking off regular upper body clothing?   '[ ]'  1   '[ ]'  2   '[ ]'  3   '[X]'  4   5.  Taking care of personal grooming such as brushing teeth?   '[ ]'  1   '[ ]'  2   '[ ]'  3   '[X]'  4   6.  Eating meals?   '[ ]'  1   '[ ]'  2   '[ ]'  3   '[X]'  4   ?? 2007, Trustees of [place], under license to [employer]. All rights reserved       Score:  Initial: 21 Most Recent: X (Date: -- )   Interpretation of Tool:  Represents clinically-significant functional categories (i.e.Activities of daily living).  Score 24 23 22-20 19-14 13-9 8-7 6   Modifier CH CI CJ CK CL CM CN       ?? Self Care:              [phone] - CURRENT STATUS:           CJ - 20%-39% impaired, limited or restricted              K0254 - GOAL STATUS:                   CI - 1%-19% impaired, limited or restricted              Y7062 - D/C STATUS:                       ---------------To be determined---------------  Payor:  AETNA / Plan: [place] / Product Type: PPO /      Self Care: (8 minutes): Procedure(s) (per grid) utilized to improve and/or  restore self-care/home management as related to dressing, toileting and bedside commode transfer. Required minimal verbal cueing to facilitate compensatory activities.    Balance  Sitting: Intact  Standing: Impaired  Standing - Static: Fair  Standing - Dynamic : Fair       Patient Vitals for the past 6 hrs:    BP BP Patient Position SpO2 O2 Flow Rate (L/min) Pulse   01/02/15 1037 120/58 mmHg - 100 % - 66   01/02/15 1039 120/58 mmHg At rest;Supine 100 % 2 l/min 66   01/02/15 1041 117/60 mmHg - - - -   01/02/15 1046 116/64 mmHg - 100 % - 69   01/02/15 1051 119/68 mmHg - 100 % - 62   01/02/15 1056 116/65 mmHg - 99 % - (!) 57   01/02/15 1101 117/65 mmHg - 100 % - (!) 54   01/02/15 1106 116/63 mmHg - 100 % - (!) 55   01/02/15 1111 114/62 mmHg - 100 % - (!) 56   01/02/15 1116 117/65 mmHg - - - -   01/02/15 1117 - - 100 % - 63   01/02/15 1121 117/65 mmHg - 100 % - (!) 56   01/02/15 1126 116/65 mmHg - 100 % 2 l/min (!) 56   01/02/15 1156 116/64 mmHg - - - -   01/02/15 1157 - - 100 % - (!) 59   01/02/15 1226 118/57 mmHg - 98 % - 82   01/02/15 1250 - - 97 % - 65   01/02/15 1254 117/66 mmHg At rest;Supine 95 % - 66   01/02/15 1256 118/57 mmHg - - - -   01/02/15 1257 - - 96 % - 76   01/02/15 1322 119/70 mmHg - 99 % - 72   01/02/15 1516 116/62 mmHg - 98 % - 61       Gross Assessment: Yes  Gross Assessment  AROM: Generally decreased, functional (LLE)  Strength: Generally decreased, functional (LLE)  Sensation: Impaired (LLE)                        Most Recent Physical Functioning:   Gross Assessment:  AROM: Within functional limits (BUE)  Strength: Within functional limits (BUE)  Sensation: Intact (BUE)               Posture:     Balance:  Sitting: Intact  Standing: Impaired  Standing - Static: Fair  Standing - Dynamic : Fair  Bed Mobility:  Supine to Sit: Minimum assistance  Scooting: Minimum assistance  Wheelchair Mobility:     Transfers:  Sit to Stand: Minimum assistance  Stand to Sit: Minimum assistance                       Mental Status  Neurologic State: Lethargic  Orientation Level: Oriented X4  Cognition: Follows commands  Perception: Appears intact  Perseveration: No perseveration noted  Safety/Judgement: Fall prevention;Awareness of environment;Insight into deficits      Basic ADLs (From Assessment) Complex ADLs (From Assessment)   Basic ADL  Feeding: Setup  Oral Facial Hygiene/Grooming: Setup  Bathing: Minimum assistance  Upper Body Dressing: Setup  Lower Body Dressing: Minimum assistance  Toileting: Minimum assistance Instrumental ADL  Meal Preparation: Maximum assistance  Homemaking: Maximum assistance  Medication Management:  Independent  Financial Management: Independent   Grooming/Bathing/Dressing Activities of Daily Living     Cognitive Retraining  Safety/Judgement: Fall prevention;Awareness of environment;Insight into deficits           Toileting  Toileting Assistance: Supervision/set up  Bladder Hygiene: Supervision/set-up  Clothing Management: Minimum assistance  Adaptive Equipment: Other (comment) (crutches, bedside commode)     Functional Transfers  Toilet Transfer : Minimum assistance;Adaptive equipment  Adaptive Equipment: Bedside commode;Other (comment) (crutches)   Lower Body Dressing Assistance  Socks: Maximum assistance Bed/Mat Mobility  Supine to Sit: Minimum assistance (LLE)  Sit to Stand: Minimum assistance  Scooting: Minimum assistance          Braces/Orthotics/Lines/Other:   ?? Hand Dominance: right handed  ?? IV  ?? O2 Device: Room air    Safety:   After treatment position/precautions:  ?? Up in chair  ?? Bed/Chair-wheels locked  ?? Bed in low position  ?? Call light within reach  ?? RN notified  ?? Family at bedside  ?? ice man on hip    Progression/Medical Necessity:   ?? Patient demonstrates excellent rehab potential due to higher previous functional level.  Compliance with Program/Exercises: compliant all of the time.   Reason for Continuation of Services/Other Comments:   ?? Patient continues to require present interventions due to patient's inability to care for self at independent level while maintaining TDWB.  Recommendations/Intent for next treatment session: Treatment next visit will focus on advancements to more challenging activities and reduction in assistance provided.  Total Treatment Duration:  Time In: 1423  Time Out: [place], OTR/L

## 2015-01-02 NOTE — H&P (Signed)
Outpatient Surgery History and Physical:  Alison StallingOlivia Graham was seen and examined.    CHIEF COMPLAINT:   Left hip pain.     PE:   BP 134/84 mmHg   Pulse 75   Temp(Src) 98.8 ??F (37.1 ??C)   Resp 18   Ht 5\' 7"  (1.702 m)   Wt 60.328 kg (133 lb)   BMI 20.83 kg/m2   SpO2 97%    Heart:   Regular rhythm      Lungs:  Are clear      Past Medical History:    Patient Active Problem List    Diagnosis   ??? URI (upper respiratory infection)   ??? Body aches   ??? Cough       Surgical History: No past surgical history on file.    Social History: Patient  reports that she has never smoked. She has never used smokeless tobacco. She reports that she does not drink alcohol or use illicit drugs.    Family History:   Family History   Problem Relation Age of Onset   ??? Elevated Lipids Father    ??? Hypertension Mother        Allergies: Reviewed per EMR  No Known Allergies    Medications:    No current facility-administered medications on file prior to encounter.     Current Outpatient Prescriptions on File Prior to Encounter   Medication Sig   ??? norethindrone ac-eth estradiol (LOESTRIN 1.5/30, 21,) 1.5-30 mg-mcg tab Take 1 Tab by mouth nightly.       The surgery is planned for the left hip.        History and physical has been reviewed. The patient has been examined. There have been no significant clinical changes since the completion of the originally dated History and Physical.  Patient identified by surgeon; surgical site was confirmed by patient and surgeon.      The patient is here today for outpatient surgery. I have examined the patient, no changes are noted in the patient's medical status. Necessity for the procedure/care is still present and the history and physical above is current.  See the office notes for the full long term history of the problem.  Please see the recent office notes for the musculoskeletal examination.    Signed By: Herschel SenegalBenjamin S Nyema Hachey, MD     January 02, 2015 6:27 AM

## 2015-01-02 NOTE — Progress Notes (Signed)
TRANSFER - IN REPORT:    [facility] on Alison Stallinglivia Schremp  being received from PACU for routine post - op      Report consisted of patient???s Situation, Background, Assessment and   Recommendations(SBAR).     Information from the following report(s) SBAR, Kardex, OR Summary, Intake/Output and MAR was reviewed with the receiving nurse.    Opportunity for questions and clarification was provided.      Assessment completed upon patient???s arrival to unit and care assumed.

## 2015-01-02 NOTE — Op Note (Signed)
Telephone call to patient's mom who verified 4 digit security code.  Provided patient's mom with surgical update.

## 2015-01-02 NOTE — Progress Notes (Signed)
Per Dr Alycia RossettiKoch, premedicate pt with Zofran prior to giving the pain medication

## 2015-01-02 NOTE — Anesthesia Pre-Procedure Evaluation (Signed)
Anesthetic History               Review of Systems / Medical History  Patient summary reviewed, nursing notes reviewed and pertinent labs reviewed    Pulmonary                   Neuro/Psych              Cardiovascular                  Exercise tolerance: >4 METS     GI/Hepatic/Renal           PUD     Endo/Other             Other Findings              Physical Exam    Airway  Mallampati: I  TM Distance: 4 - 6 cm  Neck ROM: normal range of motion   Mouth opening: Normal     Cardiovascular  Regular rate and rhythm,  S1 and S2 normal,  no murmur, click, rub, or gallop             Dental  No notable dental hx       Pulmonary  Breath sounds clear to auscultation               Abdominal         Other Findings            Anesthetic Plan    ASA: 1  Anesthesia type: general      Post-op pain plan if not by surgeon: peripheral nerve block single    Induction: Intravenous  Anesthetic plan and risks discussed with: Patient and Mother

## 2015-01-02 NOTE — Progress Notes (Signed)
Spoke to [name] and her mother in room 222 about discharge planning.  Her plan is home tomorrow, with outpatient PT appointment at 1 pm.  [facility] (fax [phone]; phone [phone]) for delivery to room 222 prior to her discharge.  She will bring in her crutches for our therapists to adjust.  No other discharge needs identified.

## 2015-01-02 NOTE — Other (Signed)
Report received from Marshell GarfinkelSusan Latham RN, relief for lunch break. Pt denies any c/o, smiling, family brought to bedside and updated.

## 2015-01-02 NOTE — Op Note (Signed)
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]   LEFT HIP LABRAL TEARING WITH FEMORAL ACETABULAR IMPINGEMENT    Postoperative diagnosis:  LEFT LABRAL TEARING WITH FEMORAL ACETABULAR IMPINGEMENT    Surgeon(s) and Role:     * Herschel Senegal, MD - Primary     Assistant: Lemar Lofty, CFA    Anesthesia: General, fascia iliaca block    Antibiotics: Ancef 2 grams IV    Procedures:  Procedure(s):  LEFT HIP ARTHROSCOPY FEMOROPLASTY / ACETABULOPLASTY WITH LABRAL REPAIR  to include radiographic and manual examination under anesthesia(CPT 73503)  16109  [id number]    TRACTION TIME: 68 total Minutes    INDICATIONS: 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 They have failed conservative course of management including but not limited to NSAID's, physical therapy with a home exercise program, activity modification, and intra-articular injection. After in-depth discussion of options, given the radiographic findings or pathology noted and mechanical nature of their symptoms, that surgical intervention would be appropriate. We discussed the risks and benefits including but not limited to infection, heterotopic ossification, injury to nerves and vessels, stiffness, need for further surgery, DVT, PE, MI, and other anesthesia related risks. We discussed the expected post operative regimen and return to activities as well as limitations for weight bearing or ROM in certain cases. We also discussed post operative use of Vimovo for 2 weeks for HO prevention. All questions were answered and they were amenable to proceeding.   In the office and in the pre-operative area both verbal and written consents were obtained. The operative extremity was also marked by the patient and myself with indelible marker.    FINDINGS:   1. Synovium - A lot of anterior synovium inflammatory change.   2. Labrum - Large bulbus tear at around 9 -12 O'clock with significant labral bruising.   3. Chondrolabral junction - articular cartilage was stable but there was some mild involvement just inside of the chondrolabral junction.   4. Cartilage, fovea, ligamentum -  Normal cartilage on the femoral head and acetabulum. The fovea and ligamentum were normal.   5. 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   6. Periphery - Mild cam impingement when evaluated on dynamic exam mainly along the anterior medial lateral femoral neck. No issues in abduction,     PROCEDURE DETAILS:  The patient was taken to the operating room and all areas of pressure were well padded. Preoperative antibiotics, general anesthetic, and intubation was performed by the Anesthesia Team.  After adequate general anesthesia was obtained, the patient was positioned supine on the table using the hip arthroscopy attachment. An oversized well padded perineal post and well padded traction boots were applied to both lower extremities. A sequential compression device was placed on the non-operative lower extremity with an SCD. Minimal provisional traction was applied to the non-operative leg. Provisional traction was then applied to the operative leg using flouroscopy to confirm that adequate traction could be applied.   Prior to the beginning of the procedure, a time-out was performed for correct surgical site identification as marked during the pre-operative meeting. This was confirmed using the written consent and history/physical. Time-out for antibiotic dosing, timing and selection was also performed.     Due to the complexity  of the surgery and specific positioning it requires the use of a certified first assistant is medically necessary for positioning, retraction, anchor placement and assistance in closure as  there are no qualified residents or physician assistant available.     Prior to beginning of the case as well as during the entire case, radiologic exam was performed and interpreted. In the begining, 6 preoperative images were obtained. Zero degrees of flexion with 20 internal rotation, neutral rotation, and 30 degrees of external rotation. Then with the hip in 40-50 degrees, neutral rotation, 40 degrees of external rotation, and 60 degrees of external rotation to identify the femoral deformity from 11:45 to 2:45 position. These images are used for surgical guidance and for proper completion of the procedure.    The complexity of the total joint surgery requires the use of a certified first assistant for positioning, retraction and assistance in closure as there was no other qualified help available.     The operative hip was then prepped using Chloroprep and draped in the standard fashion.   Once this was confirmed, formal traction was applied to the operative hip in slight flexion, neutral rotation and slight abduction. A standard anterior peritrochanteric portal was established at a point just anterior and superior to the superior proximal tip of the greater trochanter. Image intensification in the AP plane was used as a guide for needle localization intra-articularly. An air arthrogram was identified with c-arm to confirm needle placement and then a nitinol wire was placed again indicating appropriate position of the starting point intra-articularly. A superficial incision was made with an 11 blade scalpel and blunt dissection through the IT band was performed using a 5 mm cannula. The cannula was advanced atraumatically into the joint using flouroscopy when needed. The anterior triangle was visualized and a mid-anterior portal was established about 6 cm distal and with an angle of about 50 degrees off from the axial plane from the anterolateral peri-trochanteric portal. This  was done using needle localization under direct visualization. The nitinol wire was placed and a superficial skin incision was performed with the 11 blade. A straight hemostat was initially used to superficially dissect through the soft tissue, then a 5 mm cannula was inserted into the joint. A capsulotomy was performed connecting the mid-anterior portal to the anterior peri-trochanteric portal using a Beaver blade. A motorized shaver was used for resecting the synovium peripherally. Radiofrequency ablation was used for cauterization when needed.   The central compartment was examined with the probe and the scope. As noted, there was evidence of labral deformation and tearing from 9-12 o'clock. Minimal small area of chondrolabral changes as noted above. The ligamentum was normal and the inner most aspect of the acetabulum and femoral head appeared normal.     Once the acetabular rim was defined, the labrum was meticulously prepared trying not to separate the labrum from the articular cartilage unless needed. During the preparation of the labrum, any areas of excess bone and unstable chondrolabral articular cartilage were identified. Using biters and motorized shavers the area of unstable or excess cartilage was stabilized to more of a normal chondrolabral junction. The motorized burr was used to create a healthy bleeding bed of bone for labral refixation.     The pincer lesion was addressed both visually and flouroscopically correcting any visualized cross-over.   Using flouroscopy and direct visualization the labrum was fixed using 3 anchors in parallel fashion tangential to the articular margin. Using the guide the rim was drilled  and the anchor then inserted. The labral fixation was done by placing the anchor stitch through the space between the rim and the labrum and then using a labral penetrator to pass the stitch through the labrum. Two luggage tag looping stitch were used at the  9:30 and 11:30 O'clock position to prevent inversion of the labrum with weightbearing. The labral tissue was also very bulbus at the medial aspect of the tear. The stitch was tied using standard arthroscopic technique. This was done in sequential fashion until the labrum was stable throughout. The anchors were placed in the 9:30, 10:30 and 11:30 o'clock positions. Good reapproximation of the labrum to the chondral margin was obtained establishing the suction seal function of the labrum. The traction was removed at this time.  Attention was then turned to the peripheral compartment. With traction removed, the leg was flexed to about 40-50 degrees and using abduction and adduction the femoral head neck junction was visualized. Flouroscopy and direct visualization was used to identify the area of cam impingement.   Using the curets, motorized shaver and radiofrequency device the superficial area of planned bone resection was marked. This was checked with flouroscopy. The 5.5 mm burr was then used to perform the osteochondroplasty along the femur re-creating the appropriate head neck off set. The hip was checked in abduction, flexion, internal rotation dynamically and was noted to clear nicely without further evidence of impingement.    The previous films were repeated and checked as well. These include zero degrees of  flexion with 20 internal rotation, neutral rotation, and 30 degrees of external rotation. Then with the hip in 40-50 degrees, neutral rotation, 40 degrees of external rotation, and 60 degrees of external rotation to identify the femoral deformity correction from 11:45 to 2:45 position. The acetabular correction was also verified. The hardware and bony alignment, position and orientation of resection were appropriate and consistent with the procedure and without evidence of complication.  The bony debris generated from the burr was then irrigated.    The capsule was then repaired in the intra-portal capsulotomy using three #2 Vicryl absorbable stitches. The leg was in slight flexion and internal rotation and the capsular stitches tied in standard arthroscopic fashion to approximate the capsular edges. Instruments were removed.   30 ml's of ropivicaine was injected into the soft tissues.  A 3-0 Nylon stitch was used to close the portals. A sterile dressing was applied. A blue towel was placed over the dressings and a cold therapy device was positioned. The patient was awoken in stable condition and taken to the recovery room. All counts were correct.     POSTOPERATIVE PLAN: TDWB with crutches according to the labral repair protocol.     Estimated Blood Loss:   5 ml    Fluids:    see anesthesia notes.     Implant:   Implant Name Type Inv. Item Serial No. Manufacturer Lot No. LRB No. Used Action   PIVOT NAOTACK FLEX SUTURE ANCHOR 1.4MM WITH FLEXIBILE INSERTER    [employer] [id number] Left 2 Implanted   PIVOT NANOTACK FLEX SUTURE ANCHOR 1.4MM WITH FLEXIBLE INSERTER Anchor     [employer] [id number] Left 1 Implanted       Closure: Primary    Complications: None    Signed By: Herschel Senegal, MD

## 2015-01-02 NOTE — Other (Signed)
TRANSFER - OUT REPORT:    Verbal report given to Burk[place] Fas[place]annie, RNon Alison StallingOlivia Frees  being transferred to 222 for routine post - op       Report consisted of patient???s Situation, Background, Assessment and   Recommendations(SBAR).     Information from the following report(s) OR Summary, Intake/Output and MAR was reviewed with the receiving nurse.    Lines:   Peripheral IV 01/02/15 Right Hand (Active)   Site Assessment Clean, dry, & intact 01/02/2015 10:35 AM   Phlebitis Assessment 0 01/02/2015 10:35 AM   Infiltration Assessment 0 01/02/2015 10:35 AM   Dressing Status Clean, dry, & intact 01/02/2015 10:35 AM   Dressing Type Transparent;Tape 01/02/2015 10:35 AM   Hub Color/Line Status Infusing 01/02/2015 10:35 AM        Opportunity for questions and clarification was provided.      Patient transported with:

## 2015-01-02 NOTE — Brief Op Note (Signed)
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]  Postoperative Diagnosis: LEFT HIP LABRAL TEAR/ FAI    Procedure(s):  LEFT HIP ARTHROSCOPY FEMOROPLASTY / ACETABULOPLASTY WITH LABRAL REPAIR  Surgeon(s) and Role:     * Herschel SenegalBenjamin S Achillies Buehl, MD - Primary  Circ-1: Inocente Sallesharla T Candler, RN  Circ-2: Jennette KettleVivian Yarborough, RN  Radiology Technician: Shawn Stallandall T Watson, RT, R  Scrub Tech-1: Wynema BirchAngela S Mistretta  Scrub Tech-2: Mathews RobinsonsMelissa K Compton  Event Time In   Incision Start [phone]   Incision Close 1026     Anesthesia: General   Estimated Blood Loss: 5 ml  Specimens: * No specimens in log *   Findings: Left hip labral tear with pincer > cam type impingement.   Complications: none  Implants:   Implant Name Type Inv. Item Serial No. Manufacturer Lot No. LRB No. Used Action   PIVOT NAOTACK FLEX SUTURE ANCHOR 1.4MM WITH FLEXIBILE INSERTER    [employer] [id number] Left 2 Implanted   PIVOT NANOTACK FLEX SUTURE ANCHOR 1.4MM WITH FLEXIBLE INSERTER Anchor     [employer] [id number] Left 1 Implanted

## 2015-01-02 NOTE — Progress Notes (Signed)
Pt worked with therapy.  [facility]  [facility].  Sitting up in chair.  Ice machine to left hip.  C/o nausea.  No emesis.  Zofran 4mg  IV given per order.  [facility]

## 2015-01-02 NOTE — Op Note (Signed)
Telephone call to patient's mom who verified 4 digit security code.  Provided patient's mom with surgical update.

## 2015-01-02 NOTE — Op Note (Signed)
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]   LEFT HIP LABRAL TEARING WITH FEMORAL ACETABULAR IMPINGEMENT    Postoperative diagnosis:  LEFT LABRAL TEARING WITH FEMORAL ACETABULAR IMPINGEMENT    Surgeon(s) and Role:     * Herschel SenegalBenjamin S Dachelle Molzahn, MD - Primary     Assistant: Lemar LoftyMelissa Compton, CFA    Anesthesia: General, fascia iliaca block    Antibiotics: Ancef 2 grams IV    Procedures:  Procedure(s):  LEFT HIP ARTHROSCOPY FEMOROPLASTY / ACETABULOPLASTY WITH LABRAL REPAIR  to include radiographic and manual examination under anesthesia(CPT 73503)  [id number]  [id number])  [id number]  [id number]    TRACTION TIME: 68 total Minutes    INDICATIONS: 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 They have failed conservative course of management including but not limited to NSAID's, physical therapy with a home exercise program, activity modification, and intra-articular injection. After in-depth discussion of options, given the radiographic findings or pathology noted and mechanical nature of their symptoms, that surgical intervention would be appropriate. We discussed the risks and benefits including but not limited to infection, heterotopic ossification, injury to nerves and vessels, stiffness, need for further surgery, DVT, PE, MI, and other anesthesia related risks. We discussed the expected post operative regimen and return to activities as well as limitations for weight bearing or ROM in certain cases. We also discussed post operative use of Vimovo for 2 weeks for HO prevention. All questions were answered and they were amenable to proceeding.   In the office and in the pre-operative area both verbal and written consents were obtained. The operative extremity was also marked by the patient and myself with indelible  marker.    FINDINGS:  1. Synovium - A lot of anterior synovium inflammatory change.   2. Labrum - Large bulbus tear at around 9 -12 O'clock with significant labral bruising.   3. Chondrolabral junction - articular cartilage was stable but there was some mild involvement just inside of the chondrolabral junction.   4. Cartilage, fovea, ligamentum -  Normal cartilage on the femoral head and acetabulum. The fovea and ligamentum were normal.   5. 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   6. Periphery - Mild cam impingement when evaluated on dynamic exam mainly along the anterior medial lateral femoral neck. No issues in abduction,     PROCEDURE DETAILS:  The patient was taken to the operating room and all areas of pressure were well padded. Preoperative antibiotics, general anesthetic, and intubation was performed by the Anesthesia Team.  After adequate general anesthesia was obtained, the patient was positioned supine on the table using the hip arthroscopy attachment. An oversized well padded perineal post and well padded traction boots were applied to both lower extremities. A sequential compression device was placed on the non-operative lower extremity with an SCD. Minimal provisional traction was applied to the non-operative leg. Provisional traction was then applied to the operative leg using flouroscopy to confirm that adequate traction could be applied.   Prior to the beginning of the procedure, a time-out was performed for correct surgical site identification as marked during the pre-operative meeting. This was confirmed using the written consent and history/physical. Time-out for antibiotic dosing, timing and selection was also performed.     Due to the complexity  of the surgery and specific positioning it requires the use of a certified first assistant is medically necessary for positioning, retraction, anchor placement and assistance in  closure as there are no qualified residents or physician assistant available.     Prior to beginning of the case as well as during the entire case, radiologic exam was performed and interpreted. In the begining, 6 preoperative images were obtained. Zero degrees of flexion with 20 internal rotation, neutral rotation, and 30 degrees of external rotation. Then with the hip in 40-50 degrees, neutral rotation, 40 degrees of external rotation, and 60 degrees of external rotation to identify the femoral deformity from 11:45 to 2:45 position. These images are used for surgical guidance and for proper completion of the procedure.    The complexity of the total joint surgery requires the use of a certified first assistant for positioning, retraction and assistance in closure as there was no other qualified help available.     The operative hip was then prepped using Chloroprep and draped in the standard fashion.   Once this was confirmed, formal traction was applied to the operative hip in slight flexion, neutral rotation and slight abduction. A standard anterior peritrochanteric portal was established at a point just anterior and superior to the superior proximal tip of the greater trochanter. Image intensification in the AP plane was used as a guide for needle localization intra-articularly. An air arthrogram was identified with c-arm to confirm needle placement and then a nitinol wire was placed again indicating appropriate position of the starting point intra-articularly. A superficial incision was made with an 11 blade scalpel and blunt dissection through the IT band was performed using a 5 mm cannula. The cannula was advanced atraumatically into the joint using flouroscopy when needed. The anterior triangle was visualized and a mid-anterior portal was established about 6 cm distal and with an angle of about 50 degrees off from the axial plane from the anterolateral peri-trochanteric portal. This was done using needle  localization under direct visualization. The nitinol wire was placed and a superficial skin incision was performed with the 11 blade. A straight hemostat was initially used to superficially dissect through the soft tissue, then a 5 mm cannula was inserted into the joint. A capsulotomy was performed connecting the mid-anterior portal to the anterior peri-trochanteric portal using a Beaver blade. A motorized shaver was used for resecting the synovium peripherally. Radiofrequency ablation was used for cauterization when needed.   The central compartment was examined with the probe and the scope. As noted, there was evidence of labral deformation and tearing from 9-12 o'clock. Minimal small area of chondrolabral changes as noted above. The ligamentum was normal and the inner most aspect of the acetabulum and femoral head appeared normal.     Once the acetabular rim was defined, the labrum was meticulously prepared trying not to separate the labrum from the articular cartilage unless needed. During the preparation of the labrum, any areas of excess bone and unstable chondrolabral articular cartilage were identified. Using biters and motorized shavers the area of unstable or excess cartilage was stabilized to more of a normal chondrolabral junction. The motorized burr was used to create a healthy bleeding bed of bone for labral refixation.     The pincer lesion was addressed both visually and flouroscopically correcting any visualized cross-over.   Using flouroscopy and direct visualization the labrum was fixed using 3 anchors in parallel fashion tangential to the articular margin. Using the guide the rim was drilled  and the anchor then inserted. The labral fixation was done by placing the anchor stitch through the space between the rim and the labrum and then using a labral penetrator to pass the stitch through the labrum. Two luggage tag looping stitch were used at the 9:30 and 11:30 O'clock position to prevent  inversion of the labrum with weightbearing. The labral tissue was also very bulbus at the medial aspect of the tear. The stitch was tied using standard arthroscopic technique. This was done in sequential fashion until the labrum was stable throughout. The anchors were placed in the 9:30, 10:30 and 11:30 o'clock positions. Good reapproximation of the labrum to the chondral margin was obtained establishing the suction seal function of the labrum. The traction was removed at this time.  Attention was then turned to the peripheral compartment. With traction removed, the leg was flexed to about 40-50 degrees and using abduction and adduction the femoral head neck junction was visualized. Flouroscopy and direct visualization was used to identify the area of cam impingement.   Using the curets, motorized shaver and radiofrequency device the superficial area of planned bone resection was marked. This was checked with flouroscopy. The 5.5 mm burr was then used to perform the osteochondroplasty along the femur re-creating the appropriate head neck off set. The hip was checked in abduction, flexion, internal rotation dynamically and was noted to clear nicely without further evidence of impingement.    The previous films were repeated and checked as well. These include zero degrees of  flexion with 20 internal rotation, neutral rotation, and 30 degrees of external rotation. Then with the hip in 40-50 degrees, neutral rotation, 40 degrees of external rotation, and 60 degrees of external rotation to identify the femoral deformity correction from 11:45 to 2:45 position. The acetabular correction was also verified. The hardware and bony alignment, position and orientation of resection were appropriate and consistent with the procedure and without evidence of complication.  The bony debris generated from the burr was then irrigated.   The capsule was then repaired in the intra-portal capsulotomy using three #2 Vicryl absorbable  stitches. The leg was in slight flexion and internal rotation and the capsular stitches tied in standard arthroscopic fashion to approximate the capsular edges. Instruments were removed.   30 ml's of ropivicaine was injected into the soft tissues.  A 3-0 Nylon stitch was used to close the portals. A sterile dressing was applied. A blue towel was placed over the dressings and a cold therapy device was positioned. The patient was awoken in stable condition and taken to the recovery room. All counts were correct.     POSTOPERATIVE PLAN: TDWB with crutches according to the labral repair protocol.     Estimated Blood Loss:   5 ml    Fluids:    see anesthesia notes.     Implant:   Implant Name Type Inv. Item Serial No. Manufacturer Lot No. LRB No. Used Action   PIVOT NAOTACK FLEX SUTURE ANCHOR 1.4MM WITH FLEXIBILE INSERTER    [employer] [id number] Left 2 Implanted   PIVOT NANOTACK FLEX SUTURE ANCHOR 1.4MM WITH FLEXIBLE INSERTER Anchor     [employer] [id number] Left 1 Implanted       Closure: Primary    Complications: None    Signed By: Herschel Senegal, MD

## 2015-01-02 NOTE — Anesthesia Procedure Notes (Signed)
Peripheral Block    Start time: 01/02/2015 6:47 AM  End time: 01/02/2015 6:51 AM  Block Type: left fascia iliaca  Reason for block: at surgeon's request and post-op pain management  Staffing  Anesthesiologist: Oletta DarterSHERBERT, Javier Mamone D  Performed by: anesthesiologist   Prep  Risks and benefits discussed with the patient and plans are to proceed  Site marked, Timeout performed, 06:47  Monitoring: standard ASA monitoring, continuous pulse ox, heart rate, responsive to questions, oxygen and frequent vital sign checks  Injection Technique: single-shot  Procedures: ultrasound guided  Prep Solution(s): chlorhexidine  Needle  Needle: 22G Stimuplex (Echogenic)  Needle localization: ultrasound guidance    Medication Injected: 30mL 0.25% bupivacaine with epi 1:200K  Additional Medication Injected: 20mL 0.5% bupivacaine with epi 1:200K  Assessment  Injection Assessment: incremental injection every 5 mL, local visualized surrounding nerve on ultrasound, negative aspiration for blood, no paresthesia, no intravascular symptoms and ultrasound image on chart  Patient tolerated without any apparent complications  Additional Notes  Ultrasound Block Procedure Note    Block placed for post op pain at surgeon's request.  Risks, benefits, and alternatives of left fascia iliaca nerve block discussed with patient and/or family.  Patient elects to proceed.  Standard monitors, oxygen per nasal cannula, and appropriate sedation utilized.  Sterile prep with chlorhexidine.  Ultrasound used to identify anatomy of nerve bundle.  22 gauge needle placement and local injection at perineural area confirmed with ultrasound guidance.  Negative aspiration and no paresthesia.  30 ml 0.25% Marcaine with 1:200,000 epi and 20 ml 0.5% Marcaine with epi injected in 5 ml increments with negative aspiration and absence of intravascular symptoms. Image placed on chart.  Patient tolerated procedure well.  No apparent complications.

## 2015-01-03 MED FILL — DOCUSATE SODIUM 100 MG CAP: 100 mg | ORAL | Qty: 1

## 2015-01-03 MED FILL — OXYCODONE-ACETAMINOPHEN 7.5 MG-325 MG TAB: ORAL | Qty: 1

## 2015-01-03 MED FILL — OXYCODONE 5 MG TAB: 5 mg | ORAL | Qty: 2

## 2015-01-03 MED FILL — ONDANSETRON (PF) 4 MG/2 ML INJECTION: 4 mg/2 mL | INTRAMUSCULAR | Qty: 2

## 2015-01-03 MED FILL — TYLENOL 325 MG TABLET: 325 mg | ORAL | Qty: 2

## 2015-01-03 MED FILL — NAPROXEN 250 MG TAB: 250 mg | ORAL | Qty: 2

## 2015-01-03 NOTE — Progress Notes (Signed)
Pt's D/C instructions completed.  Verbalized understanding of all instructions including diet, activity, s/sx to alert MD, medications, wound care, and f/u appointment. Patient to be discharged at time to go straight to Outpatient PT.

## 2015-01-03 NOTE — Progress Notes (Signed)
[facility] not in network with [employer] insurance, but [facility] (phone [phone]; fax [phone]) is.  Fax[facility] to them [facility], they will have ready for p/u at [address], [facility], [address] (they do not deliver).  Spoke to Ms. Samuel BoucheLucas and her mother in room 222, and mother will pick up while patient in outpatient PT [address].

## 2015-01-03 NOTE — Progress Notes (Signed)
Problem: Mobility Impaired (Adult and Pediatric)  Goal: *Acute Goals and Plan of Care (Insert Text)  Acute care goals:  (1.)Brooke Graham will move from supine to sit and sit to supine with STAND BY ASSIST within 3 day(s).   (2.)Brooke Graham will transfer from bed to chair and chair to bed with CONTACT GUARD ASSIST using the least restrictive device within 3 day(s).   (3.)Brooke Graham will ambulate with CONTACT GUARD ASSIST for 50 feet while maintaining TDWB LLE status with the least restrictive device within 3 day(s).   (4.)Brooke Graham will verbalize and demonstrate compliance with TDWB LLE with ZERO verbal cues within 3 day(s).  ________________________________________________________________________________________________  PHYSICAL THERAPY: Daily Note, Treatment Day: 1st and AM  OBSERVATION: [employer] : Hospital Day: 2     TDWB LLE  ROM restrictions: limit PROM hip flexion 70 degrees (can sit EOB hip flexion 90 degrees), limit hip abduction 20 degrees    NAME/AGE/GENDER: Brooke Graham is a 19 y.o. 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 Patient seen this AM for treatment prior to discharge and follow up PT appointment at 1pm.  Patient reports no pain currently and continues to have mild numbness in L thigh.  Patient assisted with donning clothes today and instructions in donning/doffing ice man pac.  Patient then ambulated in room while maintaining toe-down WB.  Patient  positioned in chair with needs in reach.  Patient commented feeling more confident with mobility today.  Great progress, patient is being discharged this am, will complete PT order at this time.       ????????This section established at most recent assessment??????????  PROBLEM LIST (Impairments causing functional limitations):  1. Decreased Ambulation Ability/Technique  2. Decreased Balance  3. Decreased Flexibility/Joint Mobility  REHABILITATION POTENTIAL FOR STATED GOALS: EXCELLENT      PLAN OF CARE:   INTERVENTIONS PLANNED: (Benefits and precautions of physical therapy have been discussed with the patient.)  1. balance exercise  2. bed mobility  3. family education  4. gait training  5. range of motion: active/assisted/passive  6. therapeutic activities  7. therapeutic exercise/strengthening  8. transfer training  FREQUENCY/DURATION: Follow patient BID until goals are met in order to address above goals.    RECOMMENDED REHABILITATION/EQUIPMENT: (at time of discharge pending progress):   Outpatient: Physical Therapy.  SUBJECTIVE:   "I am so gald to get this IV out"    Present Symptoms: Patient has no c/o pain at rest.   Pain Intensity 1: 0  Pain Intervention(s) 1: Ambulation/Increased Activity     History of Present Injury/Illness: Per chart:      Review of Systems / Medical History  Patient summary reviewed, nursing notes reviewed and pertinent labs reviewed      Pulmonary    Neuro/Psych      Cardiovascular  Exercise tolerance: >4 METS      GI/Hepatic/Renal     PUD    Endo/Other    Other Findings  Prior Level of Function/Home Situation: Patient lives with her parents currently but attends [employer] and plays collegiate tennis. Lives in 2 story home at parent's house with 3 steps to enter.   Home Environment: Private residence  # Steps to Enter: 3  One/Two Story Residence: Two story  # of Interior Steps: 15  Living Alone: No  Support Systems: Parent   Patient Expects to be Discharged to:: Private residence  Current DME Used/Available at Home: Crutches  Tub or Shower Type: Multimedia programmer (built in chair)  OBJECTIVE/TREATMENT:   (In addition to Assessment/Re-Assessment sessions the following treatments were rendered)                                      [employer]??? ???6 Clicks???                                          Basic Mobility Inpatient Short Form  How much difficulty does the patient currently have... Unable A Lot A Little None   1.  Turning over in bed (including adjusting bedclothes, sheets and blankets)?   _0  1   _1  2   _2  3   _3  4   2.  Sitting down on and standing up from a chair with arms ( e.g., wheelchair, bedside commode, etc.)   _4  1   _5  2   _6  3   _7  4   3.  Moving from lying on back to sitting on the side of the bed?   _8  1   _9  2   _10  3   _11  4               How much help from another person does the patient currently need... Total A Lot A Little None   4.  Moving to and from a bed to a chair (including a wheelchair)?   _12  1   _13  2   _14  3   _15  4   5.  Need to walk in hospital room?   _16  1   _17  2   _18  3   _19  4   6.  Climbing 3-5 steps with a railing?   _20  1   _21  2   _22  3   _23  4   ?? 2007, Trustees of [place], under license to [employer]. All rights reserved       Score:  Initial: 17 Most Recent: 17 (Date: 01/02/15 )   Interpretation of Tool:  Represents activities that are increasingly more difficult (i.e. Bed mobility, Transfers, Gait).  Score 24 23 22-20 19-15 14-10 9-7 6   Modifier CH CI CJ CK CL CM CN       ?? Mobility - Walking and Moving Around:              U2353 - CURRENT STATUS:       CK - 40%-59% impaired, limited or restricted              I1443 - GOAL STATUS:               CJ - 20%-39% impaired, limited or restricted  G8980 - D/C STATUS:                    CK - 40%-59% impaired, limited or restricted  Payor: [employer] / Plan: [employer] [place] EMPL[employer]EE PLAN / Product Type: PPO /     Most Recent Physical Functioning:   Gross Assessment:                  Posture:     Balance:  Standing: Impaired  Standing - Static: Good;Constant support  Standing - Dynamic : Good  Bed Mobility:  Supine to Sit: Minimum assistance  Scooting: Minimum assistance  Wheelchair Mobility:     Transfers:  Sit to Stand: Supervision  Stand to Sit: Supervision  Bed to Chair: Supervision  Gait:  Left Side Weight Bearing: Toe touch  Speed/Cadence: Slow  Step Length: Right shortened  Gait Abnormalities: Step to gait  Distance (ft): 25 Feet (ft)  Assistive Device: Crutches  Ambulation - Level of Assistance: Supervision  Interventions: Verbal cues        Gait Training (  ):  Gait training to improve and/or restore physical functioning as related to mobility and balance.  Ambulated 25 Feet (ft) with Supervision using a Crutches and minimal Verbal cues related to their LLE TDWB status to maintain proper body mechanics.  Therapeutic Activity: (    38 Minutes):  Therapeutic activities including Bed transfers, Chair transfers, Toilet transfers and ADLs of dressing to improve mobility, strength, balance and coordination.  Required minimal Verbal cues to promote dynamic balance in standing and promote motor control of lower extremity(s).       Braces/Orthotics/Lines/Etc:   ?? O2 Device: Room air  Safety:   After treatment position/precautions:  ?? Up in chair  ?? Bed/Chair-wheels locked  ?? Bed in low position  ?? Caregiver at bedside  ?? Call light within reach  ?? RN notified  ?? Family at bedside  Progression/Medical Necessity:   ?? Patient demonstrates excellent rehab potential due to higher previous functional level.   ?? Patient is being discharged at this time to outpatient PT  Compliance with Program/Exercises: Will assess as treatment progresses.   Reason for Continuation of Services/Other Comments:  ?? Patient continues to require skilled intervention due to decreased functional mobility from baseline.    ?? Good progress, will follow up with outpatient PT  Recommendations/Intent :  Outpatient PT  Total Treatment Duration:  Time In: 1030  Time Out: [place], DPT

## 2015-01-31 ENCOUNTER — Ambulatory Visit
Admit: 2015-01-31 | Discharge: 2015-01-31 | Payer: PRIVATE HEALTH INSURANCE | Attending: Specialist | Primary: Specialist

## 2015-01-31 DIAGNOSIS — E7800 Pure hypercholesterolemia, unspecified: Secondary | ICD-10-CM

## 2015-01-31 NOTE — Progress Notes (Signed)
[facility]  Brooke Graham, M.D.  Brooke Graham, M.D.  [place], [place]  Ph No:  [phone]  Fax:  [phone]      CHIEF COMPLAINT: follow-up         HISTORY OF PRESENT ILLNESS: Brooke Graham is a 19 y.o. female with past medical history positive for no major problems.  She seen office today stating that she is here for checkup.  She recently had a surgery on her left hip.  She is recuperating.  She is getting physical therapy.  No nausea.  No vomiting.  No coughing.  No congestion.      No Known Allergies    Past Medical History   Diagnosis Date   ??? Left hip pain        No past surgical history on file.    Family History   Problem Relation Age of Onset   ??? Elevated Lipids Father    ??? Hypertension Mother        History     Social History   ??? Marital Status: SINGLE     Spouse Name: N/A   ??? Number of Children: N/A   ??? Years of Education: N/A     Occupational History   ??? Not on file.     Social History Main Topics   ??? Smoking status: Never Smoker    ??? Smokeless tobacco: Never Used   ??? Alcohol Use: No   ??? Drug Use: No   ??? Sexual Activity: Not on file     Other Topics Concern   ??? Not on file     Social History Narrative           IMMUNIZATIONS:  Immunization History   Administered Date(s) Administered   ??? DTaP 11/11/1995, 09/12/1998   ??? DTaP-Hib 10/14/1994, 12/10/1994, 02/14/1995   ??? HPV (Quad) 01/11/2011, 04/02/2011, 08/30/2011   ??? Hep A Vaccine 2 Dose Schedule (Ped/Adol) 01/15/2008, 12/06/2008   ??? IPV 09/12/1998   ??? Influenza Vaccine 04/02/2011   ??? MMR 08/17/1995, 09/12/1998   ??? Meningococcal (MCV4P) Vaccine 01/15/2008, 10/08/2012   ??? OPV 10/14/1994, 12/10/1994, 02/14/1995   ??? Tdap 10/15/2005, 10/08/2012, 12/27/2014   ??? Varicella Virus Vaccine 11/11/1995, 12/29/2006       MEDICATIONS:    Current outpatient prescriptions:   ???  norethindrone-e estradiol-iron (LOESTRIN FE) 1 mg-20 mcg (24)/75 mg (4) tab, Take  by mouth., Disp: , Rfl:    ???  Lisdexamfetamine (VYVANSE) 40 mg capsule, Take  by mouth daily., Disp: , Rfl:       REVIEW OF SYSTEMS:  GENERAL/CONSTITUTIONAL: negative  HEAD, EYES, EARS, NOSE AND THROAT: negative  CARDIOVASCULAR: negative  RESPIRATORY:  negative   GASTROINTESTINAL: negative  GENITOURINARY: negative  MUSCULOSKELETAL: negative  BREASTS: negative  SKIN:  negative  NEUROLOGIC: negative  PSYCHIATRIC: negative  ENDOCRINE:  negative  HEMATOLOGIC/LYMPHATIC:  negative  ALLERGIC/IMMUNOLOGIC:  negative        PHYSICAL EXAM  Vital Signs - BP 116/66 mmHg   Pulse 67   Ht '5\' 7"'  (1.702 m)   Wt 135 lb (61.236 kg)   BMI 21.14 kg/m2   Constitutional - alert, well appearing, and in no distress.  Eyes - pupils equal and reactive, extraocular eye movements intact.  Ear, Nose, Mouth, Throat - normal  Examination of oropharynx: oral mucosa moist  Neck - supple, no significant adenopathy.   Respiratory - clear to auscultation, no wheezes, rales or bronchi,  symmetric air entry.  Cardiovascular - normal rate, regular rhythm, normal S1, S2,       Gastrointestinal - Abdomen soft, nontender, nondistended, no masses.  Back exam - full range of motion, no tenderness, palpable spasm or pain on motion  Musculoskeletal -  No joint tenderness, deformity or swelling.  Skin - normal coloration and turgor, no rashes, no suspicious skin lesions noted.  Neurological - Cranial nerves with notation of any deficits. Motor and sensory exam is intact   Extremities - peripheral pulses normal, no pedal edema, no clubbing or cyanosis  Psychiatric - alert, oriented to person, place, and time.      LABS  Results for orders placed or performed during the hospital encounter of 01/02/15   HCG URINE, QL. - POC   Result Value Ref Range    Pregnancy test,urine (POC) NEGATIVE  NEG     TYPE & SCREEN   Result Value Ref Range    Crossmatch Expiration 01/05/2015     ABO/Rh(D) O POSITIVE     Antibody screen NEG              IMPRESSION:    ICD-10-CM ICD-9-CM     1. Pure hypercholesterolemia E78.0 272.0           PLAN:  I discussed with patient, reviewed the finding.  Her cholesterol was slightly elevated.  His HDL is very good.  She'll continue dietary regimen.  Monitor results in a year.  Follow-up was recommended.  See orders.    Brooke Jarred, MD          Dictated using voice recognition software. Proofread, but unrecognized voice recognition errors may exist.

## 2015-07-07 DIAGNOSIS — J029 Acute pharyngitis, unspecified: Secondary | ICD-10-CM

## 2015-08-01 DIAGNOSIS — M7741 Metatarsalgia, right foot: Secondary | ICD-10-CM | POA: Diagnosis not present

## 2015-08-02 ENCOUNTER — Ambulatory Visit
Admission: RE | Admit: 2015-08-02 | Discharge: 2015-08-02 | Disposition: A | Discharge status: Home / Self Care | Payer: Managed Care, Other (non HMO) | Source: Ambulatory Visit | Attending: Family Medicine | Admitting: Family Medicine

## 2015-08-02 ENCOUNTER — Other Ambulatory Visit: Payer: Self-pay | Admitting: Family Medicine

## 2015-08-02 DIAGNOSIS — M7742 Metatarsalgia, left foot: Secondary | ICD-10-CM | POA: Diagnosis present

## 2015-08-02 DIAGNOSIS — Z789 Other specified health status: Secondary | ICD-10-CM

## 2015-09-26 ENCOUNTER — Ambulatory Visit: Payer: Self-pay | Admitting: Family Medicine

## 2015-11-10 ENCOUNTER — Ambulatory Visit
Admit: 2015-11-10 | Discharge: 2015-11-10 | Payer: PRIVATE HEALTH INSURANCE | Attending: Specialist | Primary: Specialist

## 2015-11-10 DIAGNOSIS — Z0000 Encounter for general adult medical examination without abnormal findings: Secondary | ICD-10-CM

## 2015-11-10 MED ORDER — CIPROFLOXACIN 500 MG TAB
500 mg | ORAL_TABLET | Freq: Two times a day (BID) | ORAL | 0 refills | Status: DC
Start: 2015-11-10 — End: 2016-01-02

## 2015-11-10 MED ORDER — AZITHROMYCIN 250 MG TAB
250 mg | ORAL_TABLET | ORAL | 0 refills | Status: DC
Start: 2015-11-10 — End: 2016-01-02

## 2015-11-10 NOTE — Progress Notes (Signed)
[facility]  Gilma Bessette C. Posey Pronto, M.D.  Campbell Riches, M.D.  [place], [place]  Ph No:  [phone]  Fax:  [phone]      CHIEF COMPLAINT: Complete physical      HISTORY OF PRESENT ILLNESS: Ms. Prosser is a 20 y.o. female with no significant past medical history except for left hip surgery, she is being seen office today for physical.  She is scheduled to have Pap smear next week.  She denies any nausea.  No vomiting.  No abdominal pain.           No Known Allergies    Past Medical History:   Diagnosis Date   ??? Left hip pain        History reviewed. No pertinent surgical history.    Family History   Problem Relation Age of Onset   ??? Elevated Lipids Father    ??? Hypertension Mother        Social History     Social History   ??? Marital status: SINGLE     Spouse name: N/A   ??? Number of children: N/A   ??? Years of education: N/A     Occupational History   ??? Not on file.     Social History Main Topics   ??? Smoking status: Never Smoker   ??? Smokeless tobacco: Never Used   ??? Alcohol use No   ??? Drug use: No   ??? Sexual activity: Not on file     Other Topics Concern   ??? Not on file     Social History Narrative         IMMUNIZATIONS:  Immunization History   Administered Date(s) Administered   ??? DTaP 11/11/1995, 09/12/1998   ??? DTaP-Hib 10/14/1994, 12/10/1994, 02/14/1995   ??? HPV (Quad) 01/11/2011, 04/02/2011, 08/30/2011   ??? Hep A Vaccine 2 Dose Schedule (Ped/Adol) 01/15/2008, 12/06/2008   ??? IPV 09/12/1998   ??? Influenza Vaccine 04/02/2011   ??? MMR 08/17/1995, 09/12/1998   ??? Meningococcal (MCV4P) Vaccine 01/15/2008, 10/08/2012   ??? OPV 10/14/1994, 12/10/1994, 02/14/1995   ??? Tdap 10/15/2005, 10/08/2012, 12/27/2014   ??? Varicella Virus Vaccine 11/11/1995, 12/29/2006         MEDICATIONS:   Current Outpatient Prescriptions:   ???  ciprofloxacin HCl (CIPRO) 500 mg tablet, Take 1 Tab by mouth two (2) times a day., Disp: 14 Tab, Rfl: 0   ???  azithromycin (ZITHROMAX) 250 mg tablet, Take two tablets today then one tablet daily, Disp: 6 Tab, Rfl: 0  ???  norethindrone-e estradiol-iron (LOESTRIN FE) 1 mg-20 mcg (24)/75 mg (4) tab, Take  by mouth., Disp: , Rfl:   ???  Lisdexamfetamine (VYVANSE) 40 mg capsule, Take  by mouth daily., Disp: , Rfl:       REVIEW OF SYSTEMS:  GENERAL: Negative weakness, negative fatigue, native malaise, negative chills, negative fever, negative night sweats, negative allergies.  INTEGUMENTARY: Negative rash, negative jaundice.  HEMATOPOIETIC: Negative bleeding, negative lymph node enlargement, negative bruisability.  NEUROLOGIC: Negative headaches, negative syncope, negative seizures, negative weakness, negative tremor. No history of strokes, no history of other neurologic conditions.  EYES: Negative visual changes, negative diplopia, negative scotomata, negative impaired vision.  EARS: Negative tinnitus, negative vertigo, negative hearing impairment.  NOSE AND THROAT: Negative postnasal drip, negative sore throat.  CARDIOVASCULAR: Negative chest pain, negative dyspnea on exertion, negative palpations, negative edema. No history of heart attack, no history of arrhythmias.  RESPIRATORY: No history  of shortness of breath, no history of asthma, no history of chronic obstructive pulmonary disease, no history of obstructive sleep apnea.  GASTROINTESTINAL: Negative dysphagia, negative nausea, negative vomiting, negative hematemesis, negative abdominal pain.  GENITOURINARY: Negative frequency, negative urgency, negative dysuria, negative incontinence. No history of STDs.   MUSCULOSKELETAL: Negative myalgia, negative joint pain, negative stiffness, negative weakness, negative back pain.  PSYCHIATRIC: No history of psychiatric disorder.  ENDOCRINE: No history of alopecia, palpitations, sweats, dry skin, muscle weakness, fatigue.        PHYSICAL EXAM  Vitals -   Visit Vitals    ??? BP 110/74 (BP 1 Location: Left arm, BP Patient Position: Sitting)   ??? Pulse 60   ??? Ht '5\' 7"'  (1.702 m)   ??? Wt 134 lb (60.8 kg)   ??? BMI 20.99 kg/m2      General appearance - alert, well appearing, and in no distress.  Young female alert awake no acute distress  Mental status - alert, oriented to person, place, and time  Eyes - pupils equal and reactive, extraocular eye movements intact  Nose - normal and patent, no erythema, discharge or polyps  Mouth - mucous membranes moist, pharynx normal without lesions  Neck - supple, no significant adenopathy  Chest - clear to auscultation, no wheezes, rales or rhonchi, symmetric air entry  Heart - normal rate, regular rhythm, normal S1, S2, no murmur   Abdomen - soft, nontender, liver and spleen not palpable.       Back exam -no joint tenderness.      Neurological - alert, oriented, normal speech, no focal findings or movement disorder noted  Musculoskeletal -no edema noted.      Extremities -no joint tenderness.     Skin - normal coloration and turgor, no rashes, no suspicious skin lesions noted    LABS   Results for orders placed or performed during the hospital encounter of 01/02/15   HCG URINE, QL. - POC   Result Value Ref Range    Pregnancy test,urine (POC) NEGATIVE  NEG     TYPE & SCREEN   Result Value Ref Range    Crossmatch Expiration 01/05/2015     ABO/Rh(D) O POSITIVE     Antibody screen NEG            IMPRESSION     ICD-10-CM ICD-9-CM    1. Annual physical exam Z00.00 V70.0 AMB POC COMPLETE CBC,AUTOMATED ENTER      AMB POC URINALYSIS DIP STICK AUTO W/O MICRO (CIM)      COLLECTION VENOUS BLOOD,VENIPUNCTURE      AMB POC HEMOGLOBIN A1C      LIPID PANEL      METABOLIC PANEL, COMPREHENSIVE      TSH 3RD GENERATION      VITAMIN D, 1, 25 DIHYDROXY      CANCELED: AMB POC COMPLETE CBC,AUTOMATED ENTER      CANCELED: AMB POC URINALYSIS DIP STICK AUTO W/O MICRO (CIM)      CANCELED: COLLECTION VENOUS BLOOD,VENIPUNCTURE      CANCELED: METABOLIC PANEL, COMPREHENSIVE       CANCELED: LIPID PANEL      CANCELED: TSH 3RD GENERATION      CANCELED: AMB POC HEMOGLOBIN A1C      CANCELED: VITAMIN D, 1, 25 DIHYDROXY   2. Body mass index (BMI) 20.0-20.9, adult Z68.20 V85.1          PLAN : I advised her to have blood drawn.  She will stay with exercise program.  She is advised to a vitamin D level and lipid profile comprehensive panel CBC completed.  She will complete her GYN checkup.  See orders.  Follow-up recommended.                    Zenovia Jarred, MD          Dictated using voice recognition software. Proofread, but unrecognized voice recognition errors may exist.

## 2015-11-21 ENCOUNTER — Encounter: Admit: 2015-11-21 | Discharge: 2015-11-21 | Payer: PRIVATE HEALTH INSURANCE | Primary: Specialist

## 2015-11-21 DIAGNOSIS — Z0000 Encounter for general adult medical examination without abnormal findings: Secondary | ICD-10-CM

## 2015-11-21 LAB — AMB POC URINALYSIS DIP STICK AUTO W/O MICRO
Bilirubin (UA POC): NEGATIVE
Blood (UA POC): NEGATIVE
Glucose (UA POC): NEGATIVE mg/dL
Ketones (UA POC): NEGATIVE
Leukocyte esterase (UA POC): NEGATIVE
Nitrites (UA POC): NEGATIVE
Protein (UA POC): NEGATIVE
Specific gravity (UA POC): 1.02 (ref 1.001–1.035)
Urobilinogen (UA POC): 0.2 (ref 0.2–1)
pH (UA POC): 7 (ref 4.6–8.0)

## 2015-11-21 LAB — AMB POC COMPLETE CBC,AUTOMATED ENTER
ABS. GRANS (POC): 3.5 10*3/uL (ref 1.4–6.5)
ABS. LYMPHS (POC): 1.4 10*3/uL (ref 1.2–3.4)
ABS. MONOS (POC): 0.3 10*3/uL (ref 0.1–0.6)
GRANULOCYTES (POC): 66.7 % (ref 42.2–75.2)
HCT (POC): 43.6 % (ref 35–60)
HGB (POC): 14.6 g/dL (ref 11–18)
LYMPHOCYTES (POC): 27.1 % (ref 20.5–51.1)
MCH (POC): 28.9 pg (ref 27–31)
MCHC (POC): 33.5 g/dL (ref 33–37)
MCV (POC): 86.2 fL (ref 80–99.9)
MONOCYTES (POC): 6.2 % (ref 1.7–9.3)
MPV (POC): 8.3 fL (ref 7.8–11)
PLATELET (POC): 163 10*3/uL (ref 150–450)
RBC (POC): 5.06 10*6/uL (ref 4–6)
RDW (POC): 12.9 % (ref 11.6–13.7)
WBC (POC): 5.2 10*3/uL (ref 4.5–10.5)

## 2015-11-21 LAB — AMB POC HEMOGLOBIN A1C: Hemoglobin A1c (POC): 5.2 % (ref 4.6–6.2)

## 2015-11-22 LAB — VITAMIN D, 1, 25 DIHYDROXY: Calcitriol (Vit D 1, 25 di-OH): 53 pg/mL (ref 19.9–79.3)

## 2015-11-22 LAB — METABOLIC PANEL, COMPREHENSIVE
A-G Ratio: 1.8 (ref 1.2–2.2)
ALT (SGPT): 20 IU/L (ref 0–32)
AST (SGOT): 25 IU/L (ref 0–40)
Albumin: 4.4 g/dL (ref 3.5–5.5)
Alk. phosphatase: 83 IU/L (ref 39–117)
BUN/Creatinine ratio: 15 (ref 9–23)
BUN: 11 mg/dL (ref 6–20)
Bilirubin, total: 0.2 mg/dL (ref 0.0–1.2)
CO2: 21 mmol/L (ref 18–29)
Calcium: 9.1 mg/dL (ref 8.7–10.2)
Chloride: 103 mmol/L (ref 96–106)
Creatinine: 0.74 mg/dL (ref 0.57–1.00)
GFR est AA: 134 mL/min/{1.73_m2} (ref >59)
GFR est non-AA: 116 mL/min/{1.73_m2} (ref >59)
GLOBULIN, TOTAL: 2.5 g/dL (ref 1.5–4.5)
Glucose: 80 mg/dL (ref 65–99)
Potassium: 4.5 mmol/L (ref 3.5–5.2)
Protein, total: 6.9 g/dL (ref 6.0–8.5)
Sodium: 141 mmol/L (ref 134–144)

## 2015-11-22 LAB — LIPID PANEL
Cholesterol, total: 202 mg/dL — ABNORMAL HIGH (ref 100–199)
HDL Cholesterol: 77 mg/dL (ref >39)
LDL, calculated: 114 mg/dL — ABNORMAL HIGH (ref 0–99)
Triglyceride: 53 mg/dL (ref 0–149)
VLDL, calculated: 11 mg/dL (ref 5–40)

## 2015-11-22 LAB — TSH 3RD GENERATION: TSH: 3.28 u[IU]/mL (ref 0.450–4.500)

## 2015-12-21 ENCOUNTER — Encounter: Attending: Specialist | Primary: Specialist

## 2016-01-02 ENCOUNTER — Ambulatory Visit
Admit: 2016-01-02 | Discharge: 2016-01-02 | Payer: PRIVATE HEALTH INSURANCE | Attending: Specialist | Primary: Specialist

## 2016-01-02 DIAGNOSIS — Z6821 Body mass index (BMI) 21.0-21.9, adult: Secondary | ICD-10-CM

## 2016-01-02 NOTE — Progress Notes (Signed)
She was seen office today we reviewed her lab data.  There was no charge for today's visit.  Compliance with diet discussed at length.  Continue exercise program.

## 2016-03-22 ENCOUNTER — Encounter: Payer: Self-pay | Admitting: Family Medicine

## 2016-03-22 ENCOUNTER — Ambulatory Visit (INDEPENDENT_AMBULATORY_CARE_PROVIDER_SITE_OTHER): Payer: Managed Care, Other (non HMO) | Admitting: Family Medicine

## 2016-03-22 VITALS — BP 112/76 | HR 59 | Temp 98.0°F | Resp 14

## 2016-03-22 DIAGNOSIS — H9313 Tinnitus, bilateral: Secondary | ICD-10-CM

## 2016-03-22 NOTE — Progress Notes (Signed)
Patient presents today with symptoms of "roaring" from both ears. Patient denies any dizziness, headache or drainage from the ears. She denies any head trauma recently. She denies any upper respiratory symptoms. She feels like she needs to pop her ears. She has not been swimming recently or has not been traveling recently on a plane. She has not put any drops in her ears or has not started any new medications recently.  ROS: Negative except mentioned above. Vitals as per Epic.  GENERAL: NAD HEENT: no pharyngeal erythema, no exudate, no erythema of TMs, *cerumen was removed from right ear with currette, no cervical LAD RESP: CTA B CARD: RRR NEURO: CN II-XII grossly intact, -Rombergs  A/P: Tinnitus - would recommend trying to take a antihistamine decongestant to see if her symptoms improve or resolve, if her symptoms do persist or worsen or new symptoms develop she will seek medical attention.

## 2016-08-13 ENCOUNTER — Encounter: Payer: Self-pay | Admitting: Family Medicine

## 2016-08-13 ENCOUNTER — Ambulatory Visit (INDEPENDENT_AMBULATORY_CARE_PROVIDER_SITE_OTHER): Payer: Managed Care, Other (non HMO) | Admitting: Family Medicine

## 2016-08-13 VITALS — BP 124/69 | HR 67 | Temp 98.1°F | Resp 14

## 2016-08-13 DIAGNOSIS — B349 Viral infection, unspecified: Secondary | ICD-10-CM

## 2016-08-13 LAB — POCT RAPID STREP A (OFFICE): RAPID STREP A SCREEN: NEGATIVE

## 2016-08-13 MED ORDER — OSELTAMIVIR PHOSPHATE 75 MG PO CAPS: 75.0000 mg | ORAL_CAPSULE | Freq: Two times a day (BID) | ORAL | 0 refills | Status: AC

## 2016-08-13 NOTE — Progress Notes (Signed)
Patient presents with symptoms of myalgias, subjective fever, sore throat, postnasal drip for 2 days. Denies taking any medication today. Has been around others with the flu virus. Denies Cp, SOB, N/V/D, abdominal pain, severe headache.   ROS: Negative except mentioned above.  GENERAL: NAD HEENT: mild pharyngeal erythema, no exudate, no erythema of TMs, no cervical LAD RESP: CTA B CARD: RRR NEURO: CN II-XII grossly intact    A/P: Viral Illness - rapid strep was negative, patient would like to take the Tamiflu given her symptoms and close contact with others with the flu. Discussed risks/benefits of Tamiflu. Treat symptoms with OTC meds. No class or practice today. Can go back to class and practice if no fever for 24 hrs w/o taking fever lowering medication. Seek medical attention if symptoms persist or worsen as discussed.

## 2016-10-03 ENCOUNTER — Encounter: Payer: Self-pay | Admitting: Family Medicine

## 2016-10-03 ENCOUNTER — Ambulatory Visit (INDEPENDENT_AMBULATORY_CARE_PROVIDER_SITE_OTHER): Payer: 59 | Admitting: Family Medicine

## 2016-10-03 ENCOUNTER — Ambulatory Visit
Admission: RE | Admit: 2016-10-03 | Discharge: 2016-10-03 | Disposition: A | Discharge status: Home / Self Care | Payer: 59 | Source: Ambulatory Visit | Attending: Family Medicine | Admitting: Family Medicine

## 2016-10-03 DIAGNOSIS — M25572 Pain in left ankle and joints of left foot: Secondary | ICD-10-CM | POA: Diagnosis present

## 2016-10-03 DIAGNOSIS — M7662 Achilles tendinitis, left leg: Secondary | ICD-10-CM

## 2016-10-03 MED ORDER — NAPROXEN 500 MG PO TABS: 500.0000 mg | ORAL_TABLET | Freq: Two times a day (BID) | ORAL | 0 refills | Status: AC

## 2016-10-03 NOTE — Progress Notes (Signed)
Patient presents today with symptoms of left Achilles tendinitis. Patient states that she has had symptoms off and on for about a month. Recently she is started to experience some pain in the anterior aspect of her ankle. She is unsure whether the pain is related to compensation for her Achilles pain. She denies any particular injury/trauma to the ankle. She denies any significant swelling or bruising of the area. She does wear orthotics. She denies any history of stress injury in the past. She has not played tennis for the last 4-5 days due to her symptoms.  ROS: Negative except mentioned above. Vitals as per Epic. GENERAL: NAD MSK: Left ankle - no significant gross abnormality, no significant swelling or ecchymosis, decreased plantar and dorsi flexion due to pain, mild tenderness in the mid portion of the Achilles tendon and the anterior aspect of the ankle around the talus, negative [place], [place] intact NEURO: CN II-XII grossly intact   A/P: Left ankle pain, Achilles tendinitis - would recommend Naprosyn when necessary, walking boot with heel cup, ice prn, will send patient for x-ray of ankle, follow-up with trainer daily for interval change and for rehabilitation/therapy. No tennis activity for now Seek medical attention if symptoms persist or worsen as discussed.

## 2016-10-21 ENCOUNTER — Ambulatory Visit (INDEPENDENT_AMBULATORY_CARE_PROVIDER_SITE_OTHER): Payer: 59 | Admitting: Family Medicine

## 2016-10-21 ENCOUNTER — Encounter: Payer: Self-pay | Admitting: Family Medicine

## 2016-10-21 DIAGNOSIS — B070 Plantar wart: Secondary | ICD-10-CM

## 2016-10-21 NOTE — Progress Notes (Signed)
Patient presents today with a hard tiny small lesion on her right foot. Patient believes that it is a wart. She is noticed it for about a week. It does not look typical of a wart however it just appeared a week ago. She has had history of warts in the past that have felt similar. She denies any foreign objects in the foot. She has had her previous warts frozen.  ROS: Negative except mentioned above. Vitals as per Epic. GENERAL: NAD SKIN: Approximately 1 mm white circular area on plantar surface of the mid foot, it is hard on palpation, mild tenderness to the site, no black dots in the center however the area is very small, no streaks or discharge from the site NEURO: CN II-XII grossly intact   A/P:  Right foot lesion - possibly a small wart, discussed attempting cryotherapy to see if area resolves, patient agreed verbally to procedure, sterile technique was used and cryotherapy was done to the site, patient tolerated procedure, post procedure instructions were given, will seek medical attention if the area persists/worsens.

## 2016-11-24 IMAGING — CR DG FOOT COMPLETE 3+V*L*
1 series · 3 of 3 positions shown · non-contrast
Comparison: No prior.

CLINICAL DATA: Tennis player. Foot discomfort with walking. Initial
evaluation .

EXAM:
LEFT FOOT - COMPLETE 3+ VIEW

[Series 1: view not recorded · 0.14mm/px · 3 of 3 slices shown]
[im 1/3]
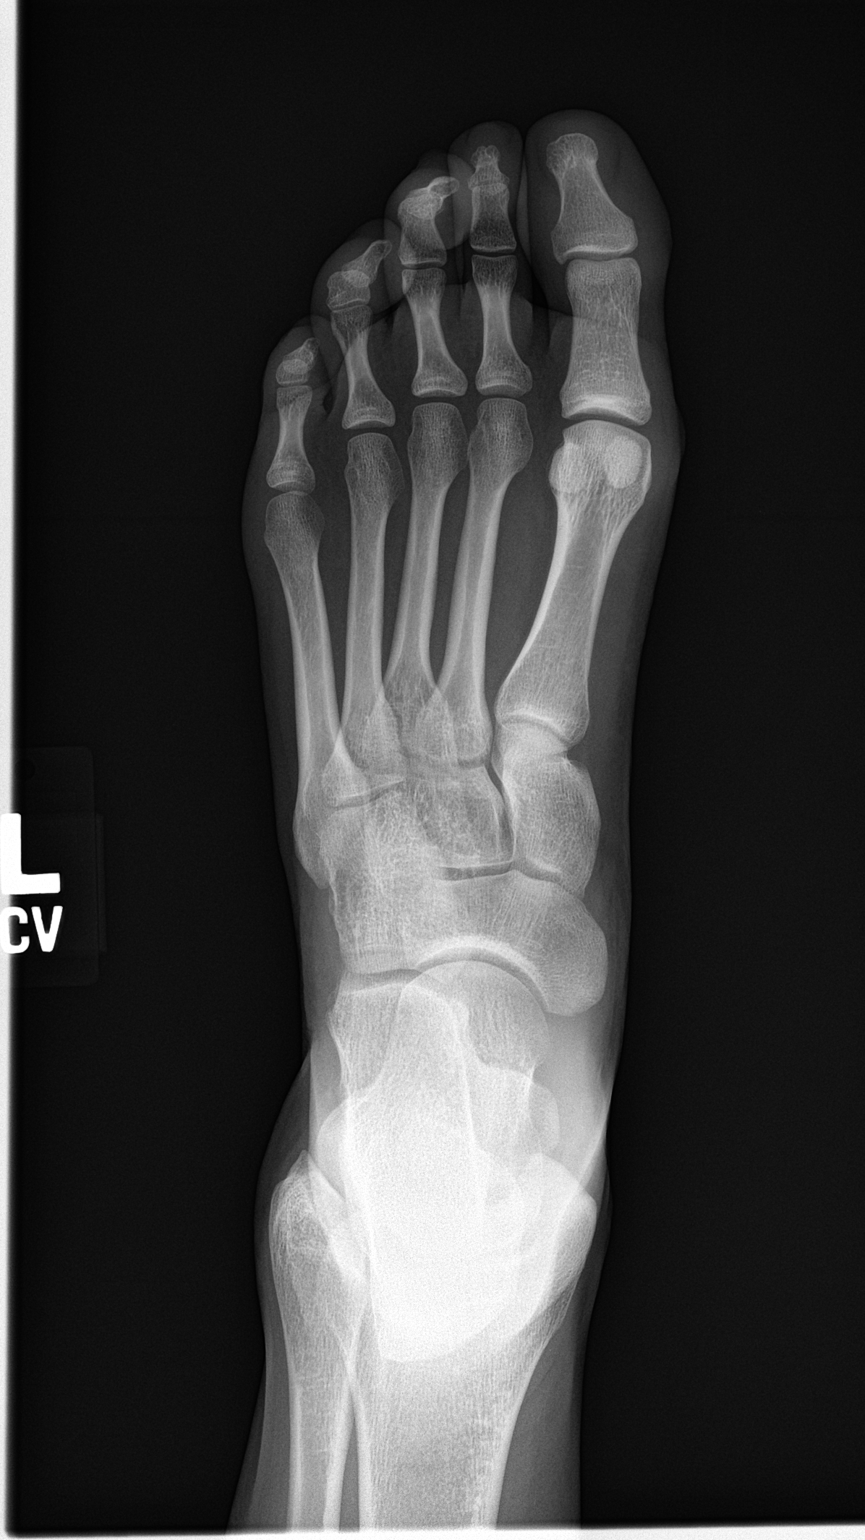
[im 2/3]
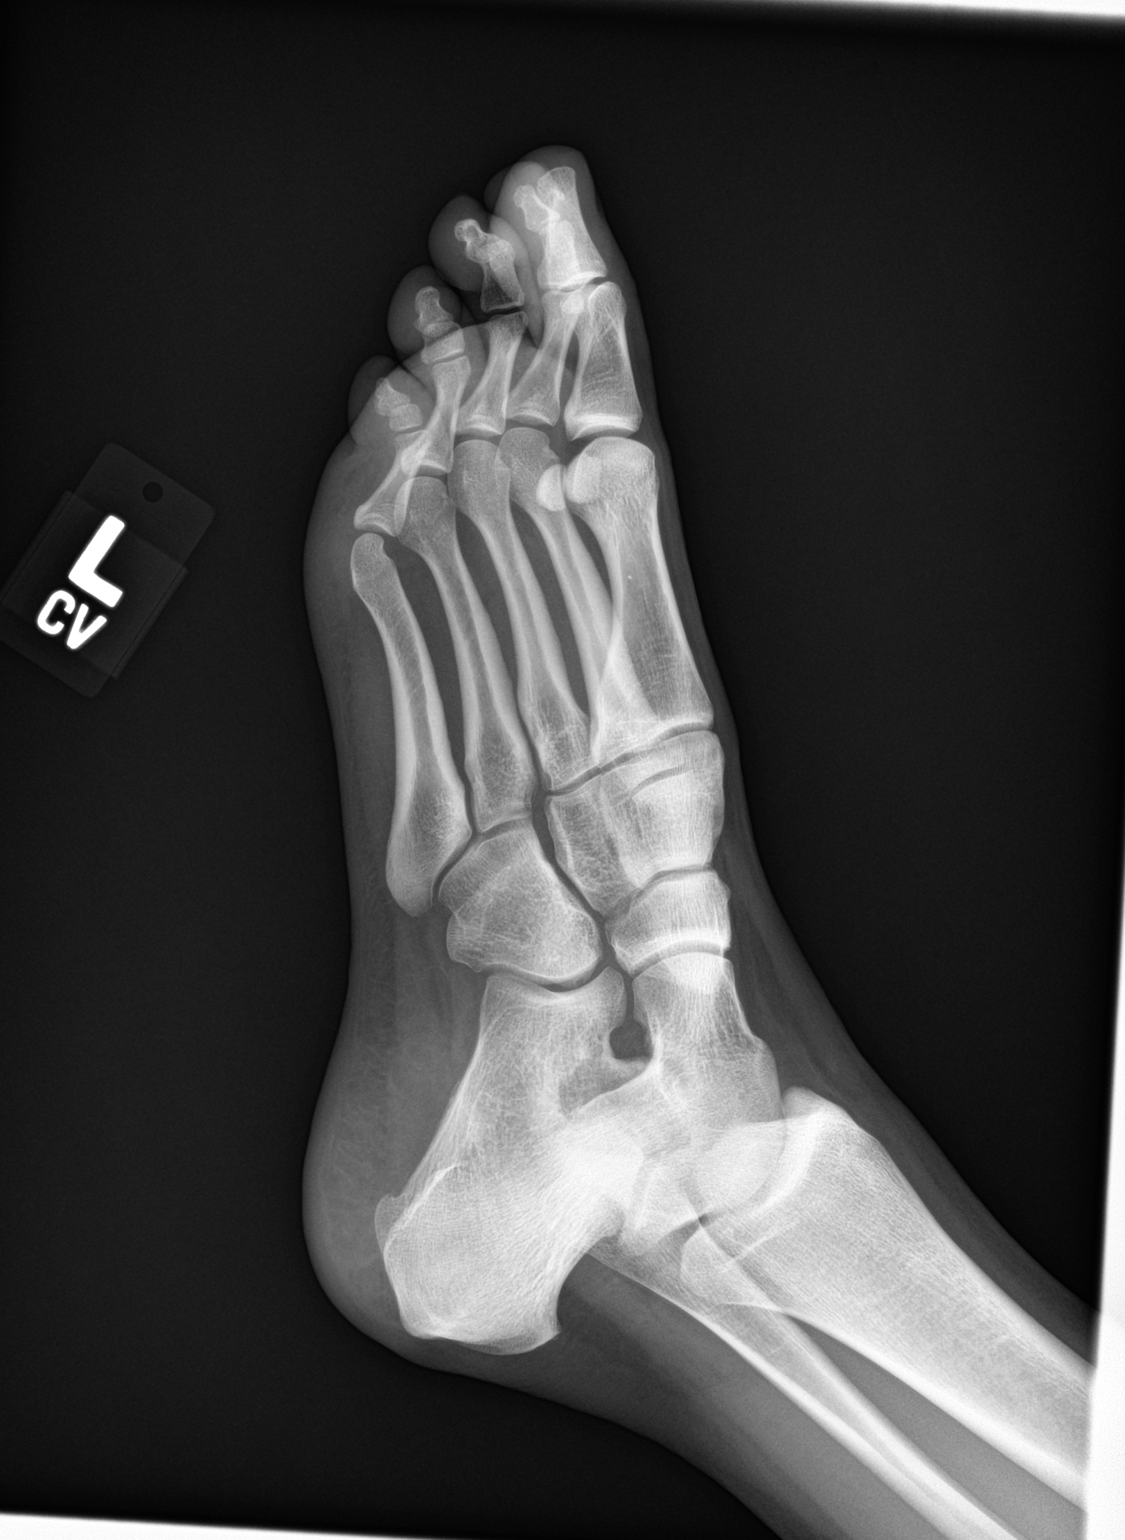
[im 3/3]
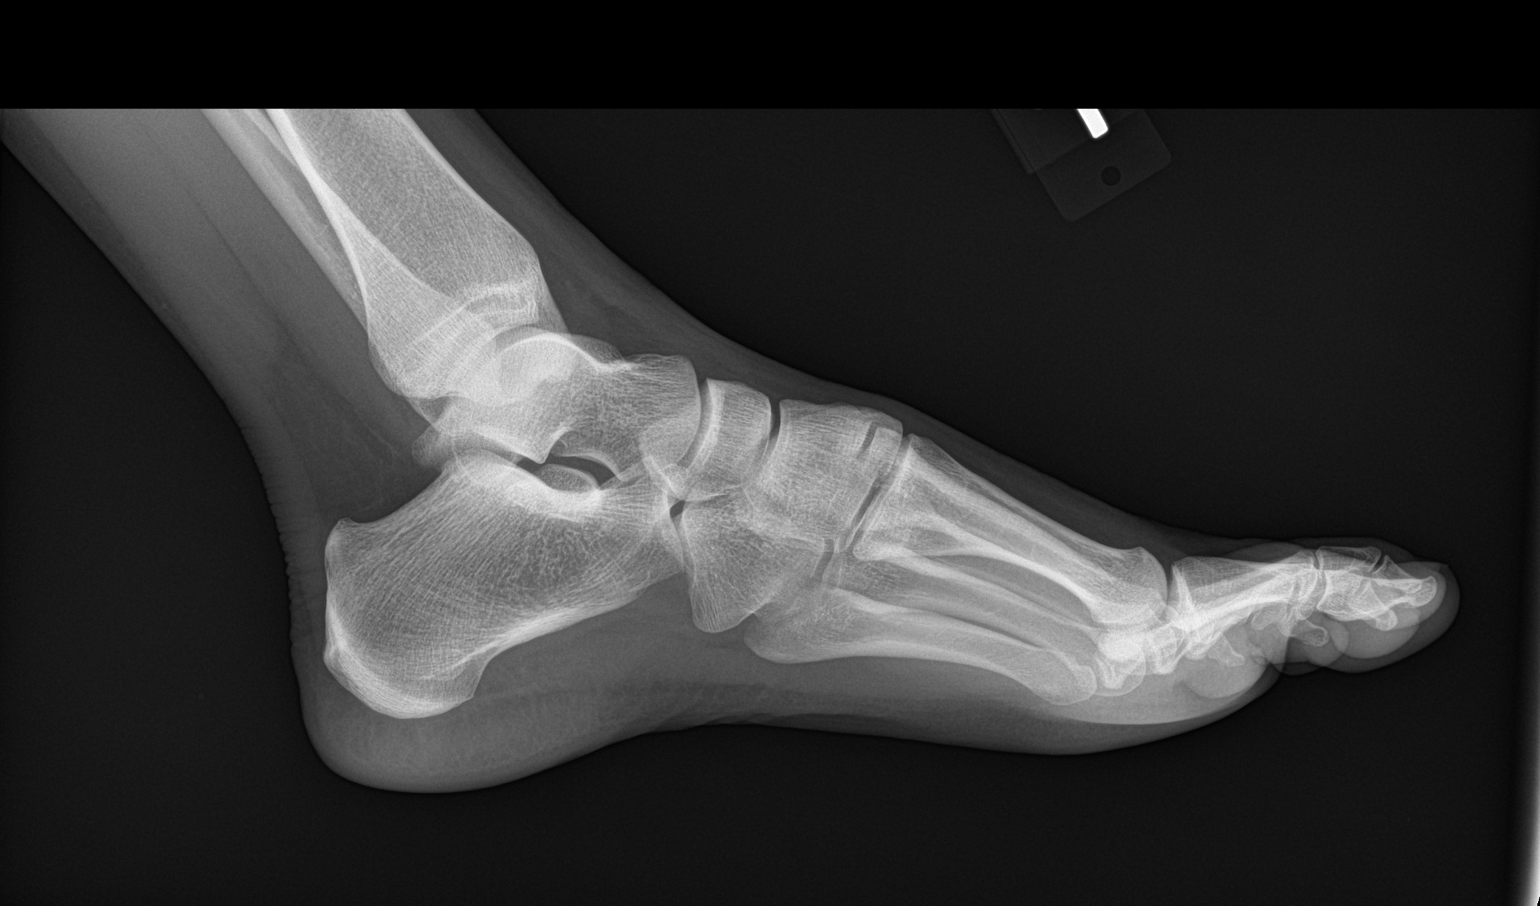

[3 of 3 positions shown; findings below may reference images not displayed]

FINDINGS: There is no evidence of fracture or dislocation. There is no
evidence of arthropathy or other focal bone abnormality. Soft
tissues are unremarkable.
IMPRESSION: No acute abnormality.

## 2017-01-16 ENCOUNTER — Other Ambulatory Visit: Admit: 2017-01-16 | Discharge: 2017-01-16 | Payer: PRIVATE HEALTH INSURANCE | Primary: Specialist

## 2017-01-16 DIAGNOSIS — Z0184 Encounter for antibody response examination: Secondary | ICD-10-CM

## 2017-01-17 LAB — HEPATITIS B SURF AB QUANT
HEPATITIS B SURF AB QUANT, 006531: 37.5 m[IU]/mL (ref >9.9)
Hepatitis B surf Ab, QT: 37.5 m[IU]/mL (ref >9.9)

## 2017-03-15 ENCOUNTER — Inpatient Hospital Stay: Primary: Specialist

## 2018-01-26 IMAGING — CR DG ANKLE COMPLETE 3+V*L*
1 series · 3 of 3 positions shown · non-contrast
Comparison: None.

CLINICAL DATA: Left ankle pain for 1 month without known injury.

EXAM:
LEFT ANKLE COMPLETE - 3+ VIEW

[Series 1: dg ankle complete left · 0.14mm/px · 3 of 3 slices shown]
[im 1/3]
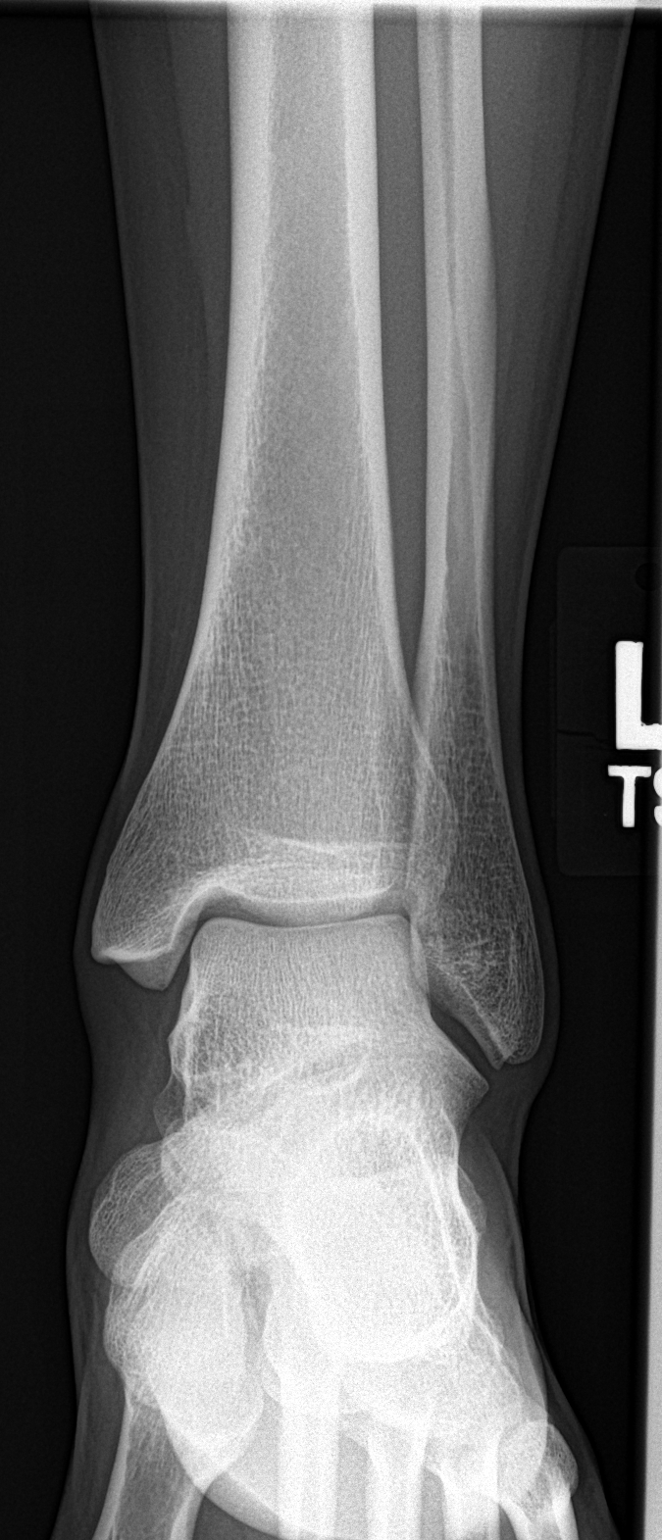
[im 2/3]
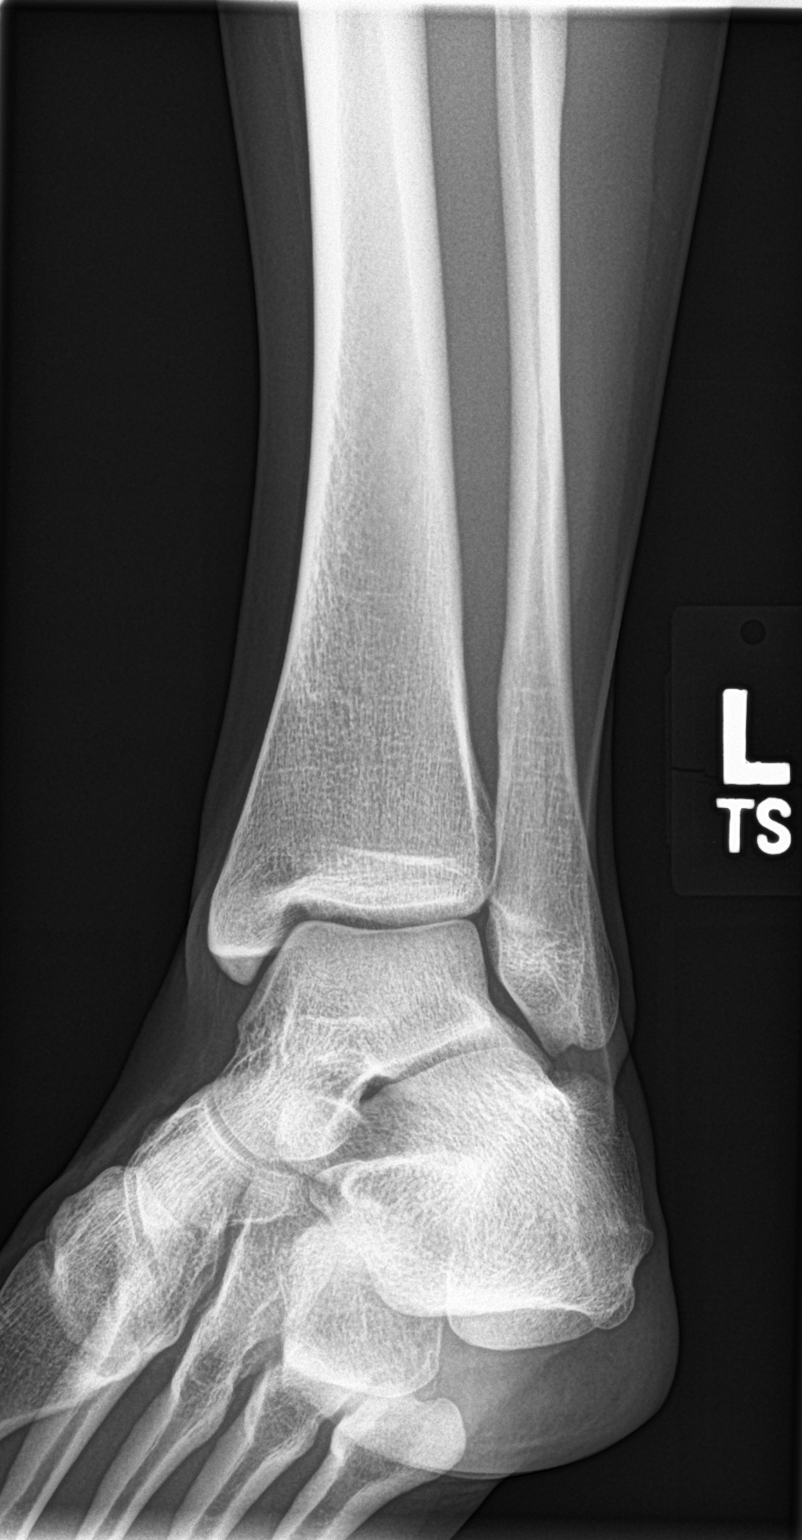
[im 3/3]
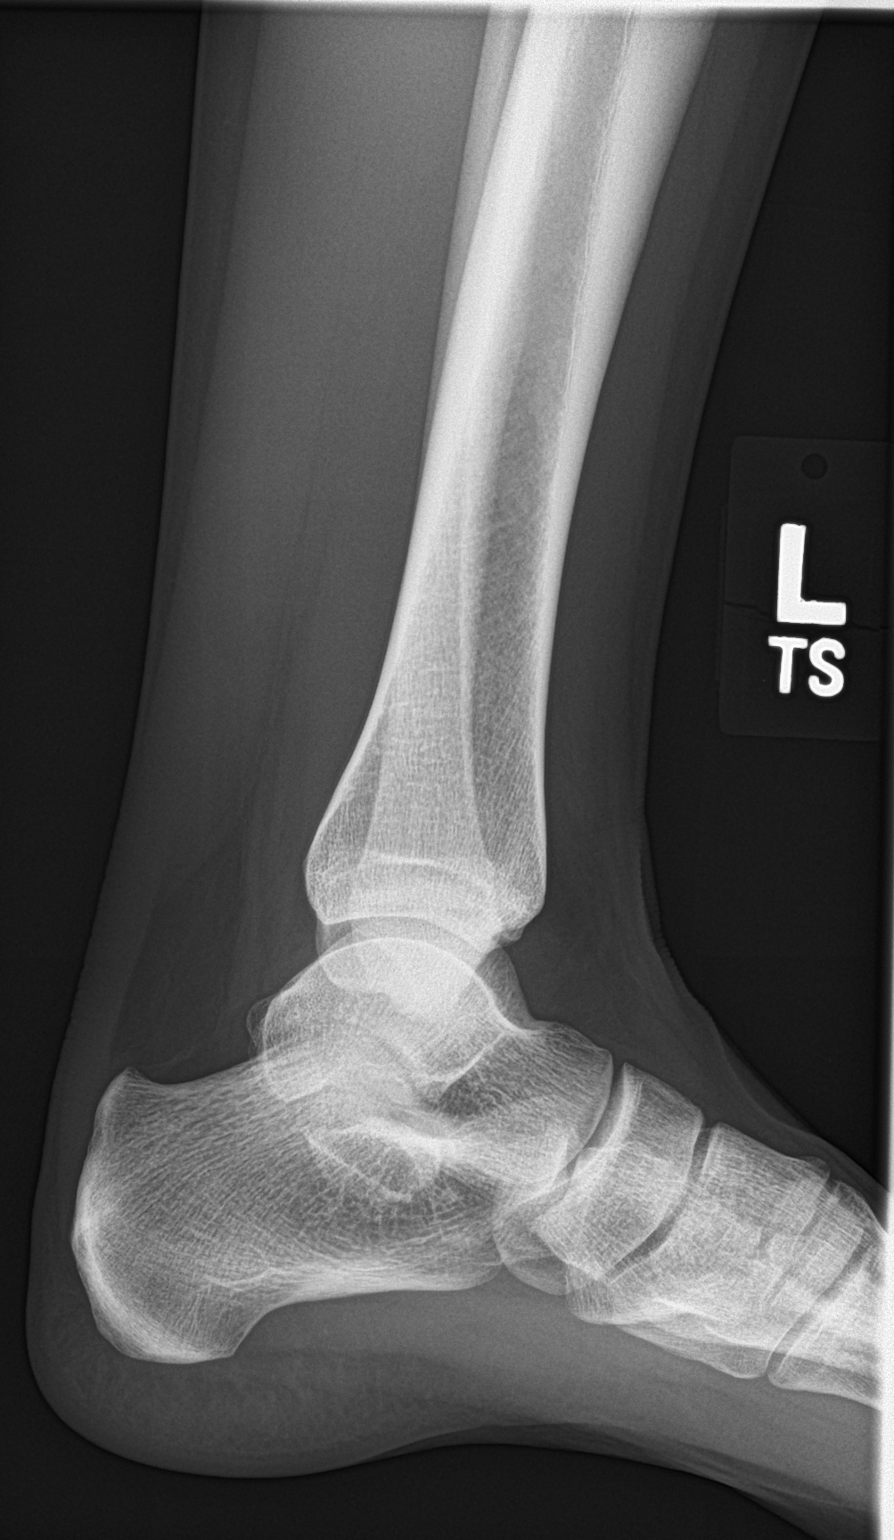

[3 of 3 positions shown; findings below may reference images not displayed]

FINDINGS: There is no evidence of fracture, dislocation, or joint effusion.
There is no evidence of arthropathy or other focal bone abnormality.
Soft tissues are unremarkable.
IMPRESSION: Normal left ankle.

## 2018-10-28 ENCOUNTER — Telehealth
Admit: 2018-10-28 | Discharge: 2018-10-28 | Payer: PRIVATE HEALTH INSURANCE | Attending: Specialist | Primary: Specialist

## 2018-10-28 ENCOUNTER — Telehealth: Attending: Specialist | Primary: Specialist

## 2018-10-28 DIAGNOSIS — R531 Weakness: Secondary | ICD-10-CM

## 2018-10-28 NOTE — Progress Notes (Signed)
Consent: Brooke Graham, who was seen by synchronous (real-time) audio-video technology, and/or her healthcare decision maker, is aware that this patient-initiated, Telehealth encounter on 10/28/2018 is a billable service, with coverage as determined by her insurance carrier. She is aware that she may receive a bill and has provided verbal consent to proceed: Yes.              [facility]  Kamare Caspers C. Posey Pronto, M.D.  Campbell Riches, M.D.  [place], [place]  Ph No:  [phone]  Fax:  [phone]      CHIEF COMPLAINT: had concerns including Hormone Problem.          HISTORY OF PRESENT ILLNESS:  Brooke Graham is a 23 y.o. female with PMH no major problems she was evaluated a virtual visit.  Patient had several things happen since I have seen her in 2017.  She is currently a Careers information officer at [employer].  She has been having some issue with the hip pain.  She had a physical therapy.  She is recuperated nicely.  She also had strep throat on 2 different location.  She had a abscess in her pharynx.  She was treated by ENT.  She says she has been concern regarding her immunity.  She was on birth control pills that upset her cycle.  She is not currently taking any birth control medication. She is having some occasional weakness.  She just wanted make sure everything is completed before going back to [place].  Her mother was also worrying about her conditions.      No Known Allergies    Past Medical History:   Diagnosis Date   ??? Left hip pain        Past Surgical History:   Procedure Laterality Date   ??? IR CHANGE DRAIN GU ABCESS PERC         Family History   Problem Relation Age of Onset   ??? Elevated Lipids Father    ??? Hypertension Mother        Social History     Socioeconomic History   ??? Marital status: SINGLE     Spouse name: Not on file   ??? Number of children: Not on file   ??? Years of education: Not on file    ??? Highest education level: Not on file   Occupational History   ??? Not on file   Social Needs   ??? Financial resource strain: Not on file   ??? Food insecurity     Worry: Not on file     Inability: Not on file   ??? Transportation needs     Medical: Not on file     Non-medical: Not on file   Tobacco Use   ??? Smoking status: Never Smoker   ??? Smokeless tobacco: Never Used   Substance and Sexual Activity   ??? Alcohol use: No   ??? Drug use: No   ??? Sexual activity: Not on file   Lifestyle   ??? Physical activity     Days per week: Not on file     Minutes per session: Not on file   ??? Stress: Not on file   Relationships   ??? Social Product manager on phone: Not on file     Gets together: Not on file     Attends religious service: Not on file     Active member of  club or organization: Not on file     Attends meetings of clubs or organizations: Not on file     Relationship status: Not on file   ??? Intimate partner violence     Fear of current or ex partner: Not on file     Emotionally abused: Not on file     Physically abused: Not on file     Forced sexual activity: Not on file   Other Topics Concern   ??? Not on file   Social History Narrative   ??? Not on file           IMMUNIZATIONS:  Immunization History   Administered Date(s) Administered   ??? DTP-Hib 10/14/1994, 12/10/1994, 02/14/1995   ??? DTaP 11/11/1995, 09/12/1998   ??? DTaP-Hib 10/14/1994, 12/10/1994, 02/14/1995   ??? HPV (Quad) 01/11/2011, 04/02/2011, 08/30/2011   ??? Hep A Vaccine 07/11/1999   ??? Hep A Vaccine 2 Dose Schedule (Ped/Adol) 01/15/2008, 12/06/2008   ??? Hep B Vaccine (Adult) 12/17/2016   ??? Hep B, Adol/Ped 05/29/95, 09/09/1994, 02/14/1995   ??? Hib 11/11/1995   ??? IPV 09/12/1998   ??? Influenza Vaccine 04/02/2011, 03/24/2012, 02/08/2013   ??? Influenza Vaccine (Quad) PF 04/10/2015, 01/30/2017   ??? MMR 09/09/1994, 08/17/1995, 09/12/1998   ??? Meningococcal (MCV4P) Vaccine 01/15/2008, 10/08/2012   ??? OPV 10/14/1994, 12/10/1994, 02/14/1995    ??? TB Skin Test (PPD) Intradermal 11/08/2014, 11/23/2015, 12/24/2016, 01/13/2017   ??? Td, Adsorbed 12/09/2013   ??? Tdap 10/15/2005, 10/08/2012, 12/27/2014   ??? Typhoid Vi Capsular Polysaccharide Vaccine 07/11/1999, 12/09/2013   ??? Varicella Virus Vaccine 11/11/1995, 12/29/2006   ??? Yellow Fever Vaccine 12/09/2013         MEDICATIONS:    Current Outpatient Medications:   ???  Lisdexamfetamine (VYVANSE) 40 mg capsule, Take  by mouth daily., Disp: , Rfl:         REVIEW OF SYSTEMS:  GENERAL/CONSTITUTIONAL: Positive for weakness.  Would like to get testing completed.  HEAD, EYES, EARS, NOSE AND THROAT: negative  CARDIOVASCULAR: negative  RESPIRATORY:  negative   GASTROINTESTINAL: negative  GENITOURINARY: negative  MUSCULOSKELETAL: negative  BREASTS: negative  SKIN:  negative  NEUROLOGIC: negative  PSYCHIATRIC: negative  ENDOCRINE:  negative  HEMATOLOGIC/LYMPHATIC:  negative  ALLERGIC/IMMUNOLOGIC:  negative        PHYSICAL EXAM  Vital Signs -   Visit Vitals  Ht '5\' 7"'  (1.702 m)   Wt 135 lb (61.2 kg)   BMI 21.14 kg/m??      Constitutional - alert, well appearing, and in no distress.  Young female resting comfortably.  No distress noted.  Eyes -extraocular eye movements intact.  Ear, Nose, Mouth, Throat -no visual abnormality noted on the ear nose or throat.  Examination of oropharynx: oral mucosa moist.  Neurological - Cranial nerves with notation of any deficits.   Psychiatric - alert, oriented to person, place, and time.    LABS  Results for orders placed or performed in visit on 01/16/17   HEPATITIS B SURF AB QUANT   Result Value Ref Range    Hepatitis B surf Ab, QT 37.5 Immunity>9.9 mIU/mL             IMPRESSION AND PLAN:  Diagnoses and all orders for this visit:    1. Weakness generalized  -     CBC WITH AUTOMATED DIFF; Future  -     LIPID PANEL; Future  -     METABOLIC PANEL, COMPREHENSIVE; Future  -     MAGNESIUM; Future  -  TSH 3RD GENERATION; Future  -     T4, FREE; Future    2. Body aches   -     CBC WITH AUTOMATED DIFF; Future  -     LIPID PANEL; Future  -     METABOLIC PANEL, COMPREHENSIVE; Future  -     MAGNESIUM; Future  -     TSH 3RD GENERATION; Future  -     T4, FREE; Future          I reviewed the finding.  Continue the medication.  Blood will be obtained.  Continue exercise program.  Depending on the lab data will have further testing.  Her cycle has been regular now.  She had estradiol and other testing done through her gynecologist last year.  Everything was normal.  I advised her that if she has a regular cycle we do not need to do any estrogen assay at this time.  Monitor symptoms.  Follow-up recommended.  All questions answered.      Patient is encouraged to engage in moderate physical activity for at least 30 minutes on at least 5 days of the week, and to follow a mediterranean diet rich in whole grains, fruits and vegetables.  The importance of a healthy lifestyle was reviewed.            Patient is to continue all other medications as directed by prescribing physicians unless addressed above in plan. Continuation of these medications from today's visit are made based on the patient's report of current medications      Dictated using voice recognition software. Proofread, but unrecognized voice recognition errors may exist.      I was in the office while conducting this encounter.      Patient location : Home          Brooke Graham is a 23 y.o. female who was evaluated by a video visit encounter for concerns as above. Patient identification was verified prior to start of the visit. A caregiver was present when appropriate. Due to this being a Scientist, physiological (During IPJAS-50 public health emergency), evaluation of the following organ systems was limited: Vitals/Constitutional/EENT/Resp/CV/GI/GU/MS/Neuro/Skin/Heme-Lymph-Imm.  Pursuant to the emergency declaration under the [place], 1135 waiver authority and the [employer] Act, this Virtual  Visit was conducted, with patient's (and/or legal guardian's) consent, to reduce the patient's risk of exposure to COVID-19 and provide necessary medical care.     Services were provided through a video synchronous discussion virtually to substitute for in-person clinic visit.         Zenovia Jarred, MD

## 2018-10-28 NOTE — Progress Notes (Signed)
Consent: Brooke Graham, who was seen by synchronous (real-time) audio-video technology, and/or her healthcare decision maker, is aware that this patient-initiated, Telehealth encounter on 10/28/2018 is a billable service, with coverage as determined by her insurance carrier. She is aware that she may receive a bill and has provided verbal consent to proceed: Yes.              [facility]  Treyveon Mochizuki C. Posey Pronto, M.D.  Campbell Riches, M.D.  [place], [place]  Ph No:  [phone]  Fax:  [phone]      CHIEF COMPLAINT: had concerns including Hormone Problem.          HISTORY OF PRESENT ILLNESS:  [name] is a 23 y.o. female with PMH no major problems she was evaluated a virtual visit.  Patient had several things happen since I have seen her in 2017.  She is currently a Careers information officer at [employer].  She has been having some issue with the hip pain.  She had a physical therapy.  She is recuperated nicely.  She also had strep throat on 2 different location.  She had a abscess in her pharynx.  She was treated by ENT.  She says she has been concern regarding her immunity.  She was on birth control pills that upset her cycle.  She is not currently taking any birth control medication. She is having some occasional weakness.  She just wanted make sure everything is completed before going back to [place].  Her mother was also worrying about her conditions.      No Known Allergies    Past Medical History:   Diagnosis Date   ??? Left hip pain        Past Surgical History:   Procedure Laterality Date   ??? IR CHANGE DRAIN GU ABCESS PERC         Family History   Problem Relation Age of Onset   ??? Elevated Lipids Father    ??? Hypertension Mother        Social History     Socioeconomic History   ??? Marital status: SINGLE     Spouse name: Not on file   ??? Number of children: Not on file   ??? Years of education: Not on file   ??? Highest education level: Not on  file   Occupational History   ??? Not on file   Social Needs   ??? Financial resource strain: Not on file   ??? Food insecurity     Worry: Not on file     Inability: Not on file   ??? Transportation needs     Medical: Not on file     Non-medical: Not on file   Tobacco Use   ??? Smoking status: Never Smoker   ??? Smokeless tobacco: Never Used   Substance and Sexual Activity   ??? Alcohol use: No   ??? Drug use: No   ??? Sexual activity: Not on file   Lifestyle   ??? Physical activity     Days per week: Not on file     Minutes per session: Not on file   ??? Stress: Not on file   Relationships   ??? Social Product manager on phone: Not on file     Gets together: Not on file     Attends religious service: Not on file     Active member of  club or organization: Not on file     Attends meetings of clubs or organizations: Not on file     Relationship status: Not on file   ??? Intimate partner violence     Fear of current or ex partner: Not on file     Emotionally abused: Not on file     Physically abused: Not on file     Forced sexual activity: Not on file   Other Topics Concern   ??? Not on file   Social History Narrative   ??? Not on file           IMMUNIZATIONS:  Immunization History   Administered Date(s) Administered   ??? DTP-Hib 10/14/1994, 12/10/1994, 02/14/1995   ??? DTaP 11/11/1995, 09/12/1998   ??? DTaP-Hib 10/14/1994, 12/10/1994, 02/14/1995   ??? HPV (Quad) 01/11/2011, 04/02/2011, 08/30/2011   ??? Hep A Vaccine 07/11/1999   ??? Hep A Vaccine 2 Dose Schedule (Ped/Adol) 01/15/2008, 12/06/2008   ??? Hep B Vaccine (Adult) 12/17/2016   ??? Hep B, Adol/Ped 14-Jul-1994, 09/09/1994, 02/14/1995   ??? Hib 11/11/1995   ??? IPV 09/12/1998   ??? Influenza Vaccine 04/02/2011, 03/24/2012, 02/08/2013   ??? Influenza Vaccine (Quad) PF 04/10/2015, 01/30/2017   ??? MMR 09/09/1994, 08/17/1995, 09/12/1998   ??? Meningococcal (MCV4P) Vaccine 01/15/2008, 10/08/2012   ??? OPV 10/14/1994, 12/10/1994, 02/14/1995   ??? TB Skin Test (PPD) Intradermal 11/08/2014, 11/23/2015, 12/24/2016,  01/13/2017   ??? Td, Adsorbed 12/09/2013   ??? Tdap 10/15/2005, 10/08/2012, 12/27/2014   ??? Typhoid Vi Capsular Polysaccharide Vaccine 07/11/1999, 12/09/2013   ??? Varicella Virus Vaccine 11/11/1995, 12/29/2006   ??? Yellow Fever Vaccine 12/09/2013         MEDICATIONS:    Current Outpatient Medications:   ???  Lisdexamfetamine (VYVANSE) 40 mg capsule, Take  by mouth daily., Disp: , Rfl:         REVIEW OF SYSTEMS:  GENERAL/CONSTITUTIONAL: Positive for weakness.  Would like to get testing completed.  HEAD, EYES, EARS, NOSE AND THROAT: negative  CARDIOVASCULAR: negative  RESPIRATORY:  negative   GASTROINTESTINAL: negative  GENITOURINARY: negative  MUSCULOSKELETAL: negative  BREASTS: negative  SKIN:  negative  NEUROLOGIC: negative  PSYCHIATRIC: negative  ENDOCRINE:  negative  HEMATOLOGIC/LYMPHATIC:  negative  ALLERGIC/IMMUNOLOGIC:  negative        PHYSICAL EXAM  Vital Signs -   Visit Vitals  Ht 5' 7"  (1.702 m)   Wt 135 lb (61.2 kg)   BMI 21.14 kg/m??      Constitutional - alert, well appearing, and in no distress.  Young female resting comfortably.  No distress noted.  Eyes -extraocular eye movements intact.  Ear, Nose, Mouth, Throat -no visual abnormality noted on the ear nose or throat.  Examination of oropharynx: oral mucosa moist.  Neurological - Cranial nerves with notation of any deficits.   Psychiatric - alert, oriented to person, place, and time.    LABS  Results for orders placed or performed in visit on 01/16/17   HEPATITIS B SURF AB QUANT   Result Value Ref Range    Hepatitis B surf Ab, QT 37.5 Immunity>9.9 mIU/mL             IMPRESSION AND PLAN:  Diagnoses and all orders for this visit:    1. Weakness generalized  -     CBC WITH AUTOMATED DIFF; Future  -     LIPID PANEL; Future  -     METABOLIC PANEL, COMPREHENSIVE; Future  -     MAGNESIUM; Future  -  TSH 3RD GENERATION; Future  -     T4, FREE; Future    2. Body aches  -     CBC WITH AUTOMATED DIFF; Future  -     LIPID PANEL; Future  -     METABOLIC PANEL,  COMPREHENSIVE; Future  -     MAGNESIUM; Future  -     TSH 3RD GENERATION; Future  -     T4, FREE; Future          I reviewed the finding.  Continue the medication.  Blood will be obtained.  Continue exercise program.  Depending on the lab data will have further testing.  Her cycle has been regular now.  She had estradiol and other testing done through her gynecologist last year.  Everything was normal.  I advised her that if she has a regular cycle we do not need to do any estrogen assay at this time.  Monitor symptoms.  Follow-up recommended.  All questions answered.      Patient is encouraged to engage in moderate physical activity for at least 30 minutes on at least 5 days of the week, and to follow a mediterranean diet rich in whole grains, fruits and vegetables.  The importance of a healthy lifestyle was reviewed.            Patient is to continue all other medications as directed by prescribing physicians unless addressed above in plan. Continuation of these medications from today's visit are made based on the patient's report of current medications      Dictated using voice recognition software. Proofread, but unrecognized voice recognition errors may exist.      I was in the office while conducting this encounter.      Patient location : Home          Vicci Mano is a 23 y.o. female who was evaluated by a video visit encounter for concerns as above. Patient identification was verified prior to start of the visit. A caregiver was present when appropriate. 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  Pursuant to the emergency declaration under the [place], 1135 waiver authority and the [employer] Act, this Virtual  Visit was conducted, with patient's  (and/or legal guardian's) consent, to reduce the patient's risk of exposure to COVID-19 and provide necessary medical care.     Services were provided through a video synchronous discussion virtually to substitute for in-person clinic visit.         Zenovia Jarred, MD

## 2018-11-11 ENCOUNTER — Telehealth
Admit: 2018-11-11 | Discharge: 2018-11-11 | Payer: PRIVATE HEALTH INSURANCE | Attending: Specialist | Primary: Specialist

## 2018-11-11 ENCOUNTER — Telehealth: Attending: Specialist | Primary: Specialist

## 2018-11-11 DIAGNOSIS — R531 Weakness: Secondary | ICD-10-CM

## 2018-11-11 NOTE — Addendum Note (Signed)
Addended by: Tilda Franco on: 11/12/2018 09:11 AM     Modules accepted: Orders

## 2018-11-11 NOTE — Progress Notes (Signed)
Consent: Brooke Graham, who was seen by synchronous (real-time) audio-video technology, and/or her healthcare decision maker, is aware that this patient-initiated, Telehealth encounter on 11/11/2018 is a billable service, with coverage as determined by her insurance carrier. She is aware that she may receive a bill and has provided verbal consent to proceed: Yes.              [facility]  Brooke Graham, M.D.  Brooke Graham, M.D.  [address], [place]  Ph No:  [phone]  Fax:  [phone]      CHIEF COMPLAINT: had concerns including Follow-up (2wk rov).          HISTORY OF PRESENT ILLNESS:  Brooke Graham is a 23 y.o. female with PMH history of orthopedic surgery, she was evaluated via virtual visit.  She was supposed to get blood work but never got completed.  She would like to get it done sometime this week.  No nausea.  No vomiting.  No chest pain.      No Known Allergies    Past Medical History:   Diagnosis Date   ??? Left hip pain        Past Surgical History:   Procedure Laterality Date   ??? IR CHANGE DRAIN GU ABCESS PERC         Family History   Problem Relation Age of Onset   ??? Elevated Lipids Father    ??? Hypertension Mother        Social History     Socioeconomic History   ??? Marital status: SINGLE     Spouse name: Not on file   ??? Number of children: Not on file   ??? Years of education: Not on file   ??? Highest education level: Not on file   Occupational History   ??? Not on file   Social Needs   ??? Financial resource strain: Not on file   ??? Food insecurity     Worry: Not on file     Inability: Not on file   ??? Transportation needs     Medical: Not on file     Non-medical: Not on file   Tobacco Use   ??? Smoking status: Never Smoker   ??? Smokeless tobacco: Never Used   Substance and Sexual Activity   ??? Alcohol use: No   ??? Drug use: No   ??? Sexual activity: Not on file   Lifestyle   ??? Physical activity     Days per week: Not on file      Minutes per session: Not on file   ??? Stress: Not on file   Relationships   ??? Social Product manager on phone: Not on file     Gets together: Not on file     Attends religious service: Not on file     Active member of club or organization: Not on file     Attends meetings of clubs or organizations: Not on file     Relationship status: Not on file   ??? Intimate partner violence     Fear of current or ex partner: Not on file     Emotionally abused: Not on file     Physically abused: Not on file     Forced sexual activity: Not on file   Other Topics Concern   ??? Not on file   Social History Narrative   ??? Not on  file           IMMUNIZATIONS:  Immunization History   Administered Date(s) Administered   ??? DTP-Hib 10/14/1994, 12/10/1994, 02/14/1995   ??? DTaP 11/11/1995, 09/12/1998   ??? DTaP-Hib 10/14/1994, 12/10/1994, 02/14/1995   ??? HPV (Quad) 01/11/2011, 04/02/2011, 08/30/2011   ??? Hep A Vaccine 07/11/1999   ??? Hep A Vaccine 2 Dose Schedule (Ped/Adol) 01/15/2008, 12/06/2008   ??? Hep B Vaccine (Adult) 12/17/2016   ??? Hep B, Adol/Ped Dec 17, 1994, 09/09/1994, 02/14/1995   ??? Hib 11/11/1995   ??? IPV 09/12/1998   ??? Influenza Vaccine 04/02/2011, 03/24/2012, 02/08/2013   ??? Influenza Vaccine (Quad) PF 04/10/2015, 01/30/2017   ??? MMR 09/09/1994, 08/17/1995, 09/12/1998   ??? Meningococcal (MCV4P) Vaccine 01/15/2008, 10/08/2012   ??? OPV 10/14/1994, 12/10/1994, 02/14/1995   ??? TB Skin Test (PPD) Intradermal 11/08/2014, 11/23/2015, 12/24/2016, 01/13/2017   ??? Td, Adsorbed 12/09/2013   ??? Tdap 10/15/2005, 10/08/2012, 12/27/2014   ??? Typhoid Vi Capsular Polysaccharide Vaccine 07/11/1999, 12/09/2013   ??? Varicella Virus Vaccine 11/11/1995, 12/29/2006   ??? Yellow Fever Vaccine 12/09/2013         MEDICATIONS:    Current Outpatient Medications:   ???  Lisdexamfetamine (VYVANSE) 40 mg capsule, Take  by mouth daily., Disp: , Rfl:         REVIEW OF SYSTEMS:  GENERAL/CONSTITUTIONAL: negative  HEAD, EYES, EARS, NOSE AND THROAT: negative   CARDIOVASCULAR: negative  RESPIRATORY:  negative   GASTROINTESTINAL: negative  GENITOURINARY: negative  MUSCULOSKELETAL: negative  BREASTS: negative  SKIN:  negative  NEUROLOGIC: negative  PSYCHIATRIC: negative  ENDOCRINE:  negative  HEMATOLOGIC/LYMPHATIC:  negative  ALLERGIC/IMMUNOLOGIC:  negative        PHYSICAL EXAM  Vital Signs -   Visit Vitals  Ht '5\' 7"'  (1.702 m)   Wt 135 lb (61.2 kg)   BMI 21.14 kg/m??      Constitutional - alert, well appearing, and in no distress.  Young female appeared to be no distress  Eyes -extraocular eye movements intact.  Ear, Nose, Mouth, Throat -no visual abnormality noted on the ear nose or throat.  Examination of oropharynx: oral mucosa moist.  Neurological - Cranial nerves with notation of any deficits.   Psychiatric - alert, oriented to person, place, and time.    LABS  Results for orders placed or performed in visit on 01/16/17   HEPATITIS B SURF AB QUANT   Result Value Ref Range    Hepatitis B surf Ab, QT 37.5 Immunity>9.9 mIU/mL             IMPRESSION AND PLAN:  Diagnoses and all orders for this visit:    1. Weakness generalized  -     AMB POC URINALYSIS DIP STICK AUTO W/O MICRO; Future  -     COLLECTION VENOUS BLOOD,VENIPUNCTURE; Future  -     AMB POC HEMOGLOBIN A1C; Future  -     CBC WITH AUTOMATED DIFF; Future  -     METABOLIC PANEL, COMPREHENSIVE; Future  -     LIPID PANEL; Future  -     TSH 3RD GENERATION; Future  -     T4, FREE; Future  -     VITAMIN B12; Future  -     VITAMIN D, 25 HYDROXY; Future    2. 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  Advised her to have blood drawn.  Continue current therapy.  She will have blood drawn here in the office.  Compliance with medication.  All questions answered.  See orders.      Patient is encouraged to engage in moderate physical activity for at least 30 minutes on at least 5 days of the week, and to follow a mediterranean diet rich in whole grains, fruits and vegetables.  The importance of a healthy lifestyle was reviewed            Patient is to continue all other medications as directed by prescribing physicians unless addressed above in plan. Continuation of these medications from today's visit are made based on the patient's report of current medications      Dictated using voice recognition software. Proofread, but unrecognized voice recognition errors may exist.      I was in the office while conducting this encounter.      Patient location : Home          Brooke Graham is a 23 y.o. female who was evaluated by a video visit encounter for concerns as above. Patient identification was verified prior to start of the visit. A caregiver was present when appropriate. 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  Pursuant to the emergency declaration under the [place], 1135 waiver authority and the [employer] Act, this Virtual  Visit was conducted, with patient's (and/or legal guardian's) consent, to reduce the patient's risk of exposure to COVID-19 and provide necessary medical care.     Services were provided through a video synchronous discussion virtually to substitute for in-person clinic visit.       Zenovia Jarred, MD

## 2018-11-11 NOTE — Addendum Note (Signed)
Addendum  Note by Tilda Franco at 11/11/18 1100                Author: Tilda Franco  Service: --  Author Type: Technician       Filed: 11/12/18 0911  Encounter Date: 11/11/2018  Status: Signed          Editor: Tilda Franco (Technician)          Addended by: Tilda Franco on: 11/12/2018 09:11 AM    Modules accepted: Orders

## 2018-11-11 NOTE — Progress Notes (Signed)
Consent: Brooke Graham, who was seen by synchronous (real-time) audio-video technology, and/or her healthcare decision maker, is aware that this patient-initiated, Telehealth encounter on 11/11/2018 is a billable service, with coverage as determined by her insurance carrier. She is aware that she may receive a bill and has provided verbal consent to proceed: Yes.              [facility]  Brooke Graham C. Posey Pronto, M.D.  Campbell Riches, M.D.  [address], [place]  Ph No:  [phone]  Fax:  [phone]      CHIEF COMPLAINT: had concerns including Follow-up (2wk rov).          HISTORY OF PRESENT ILLNESS:  Brooke Graham is a 23 y.o. female with PMH history of orthopedic surgery, she was evaluated via virtual visit.  She was supposed to get blood work but never got completed.  She would like to get it done sometime this week.  No nausea.  No vomiting.  No chest pain.      No Known Allergies    Past Medical History:   Diagnosis Date   ??? Left hip pain        Past Surgical History:   Procedure Laterality Date   ??? IR CHANGE DRAIN GU ABCESS PERC         Family History   Problem Relation Age of Onset   ??? Elevated Lipids Father    ??? Hypertension Mother        Social History     Socioeconomic History   ??? Marital status: SINGLE     Spouse name: Not on file   ??? Number of children: Not on file   ??? Years of education: Not on file   ??? Highest education level: Not on file   Occupational History   ??? Not on file   Social Needs   ??? Financial resource strain: Not on file   ??? Food insecurity     Worry: Not on file     Inability: Not on file   ??? Transportation needs     Medical: Not on file     Non-medical: Not on file   Tobacco Use   ??? Smoking status: Never Smoker   ??? Smokeless tobacco: Never Used   Substance and Sexual Activity   ??? Alcohol use: No   ??? Drug use: No   ??? Sexual activity: Not on file   Lifestyle   ??? Physical activity     Days per week: Not on file     Minutes per  session: Not on file   ??? Stress: Not on file   Relationships   ??? Social Product manager on phone: Not on file     Gets together: Not on file     Attends religious service: Not on file     Active member of club or organization: Not on file     Attends meetings of clubs or organizations: Not on file     Relationship status: Not on file   ??? Intimate partner violence     Fear of current or ex partner: Not on file     Emotionally abused: Not on file     Physically abused: Not on file     Forced sexual activity: Not on file   Other Topics Concern   ??? Not on file   Social History Narrative   ??? Not on  file           IMMUNIZATIONS:  Immunization History   Administered Date(s) Administered   ??? DTP-Hib 10/14/1994, 12/10/1994, 02/14/1995   ??? DTaP 11/11/1995, 09/12/1998   ??? DTaP-Hib 10/14/1994, 12/10/1994, 02/14/1995   ??? HPV (Quad) 01/11/2011, 04/02/2011, 08/30/2011   ??? Hep A Vaccine 07/11/1999   ??? Hep A Vaccine 2 Dose Schedule (Ped/Adol) 01/15/2008, 12/06/2008   ??? Hep B Vaccine (Adult) 12/17/2016   ??? Hep B, Adol/Ped 08-10-94, 09/09/1994, 02/14/1995   ??? Hib 11/11/1995   ??? IPV 09/12/1998   ??? Influenza Vaccine 04/02/2011, 03/24/2012, 02/08/2013   ??? Influenza Vaccine (Quad) PF 04/10/2015, 01/30/2017   ??? MMR 09/09/1994, 08/17/1995, 09/12/1998   ??? Meningococcal (MCV4P) Vaccine 01/15/2008, 10/08/2012   ??? OPV 10/14/1994, 12/10/1994, 02/14/1995   ??? TB Skin Test (PPD) Intradermal 11/08/2014, 11/23/2015, 12/24/2016, 01/13/2017   ??? Td, Adsorbed 12/09/2013   ??? Tdap 10/15/2005, 10/08/2012, 12/27/2014   ??? Typhoid Vi Capsular Polysaccharide Vaccine 07/11/1999, 12/09/2013   ??? Varicella Virus Vaccine 11/11/1995, 12/29/2006   ??? Yellow Fever Vaccine 12/09/2013         MEDICATIONS:    Current Outpatient Medications:   ???  Lisdexamfetamine (VYVANSE) 40 mg capsule, Take  by mouth daily., Disp: , Rfl:         REVIEW OF SYSTEMS:  GENERAL/CONSTITUTIONAL: negative  HEAD, EYES, EARS, NOSE AND THROAT: negative  CARDIOVASCULAR: negative  RESPIRATORY:   negative   GASTROINTESTINAL: negative  GENITOURINARY: negative  MUSCULOSKELETAL: negative  BREASTS: negative  SKIN:  negative  NEUROLOGIC: negative  PSYCHIATRIC: negative  ENDOCRINE:  negative  HEMATOLOGIC/LYMPHATIC:  negative  ALLERGIC/IMMUNOLOGIC:  negative        PHYSICAL EXAM  Vital Signs -   Visit Vitals  Ht 5' 7"  (1.702 m)   Wt 135 lb (61.2 kg)   BMI 21.14 kg/m??      Constitutional - alert, well appearing, and in no distress.  Young female appeared to be no distress  Eyes -extraocular eye movements intact.  Ear, Nose, Mouth, Throat -no visual abnormality noted on the ear nose or throat.  Examination of oropharynx: oral mucosa moist.  Neurological - Cranial nerves with notation of any deficits.   Psychiatric - alert, oriented to person, place, and time.    LABS  Results for orders placed or performed in visit on 01/16/17   HEPATITIS B SURF AB QUANT   Result Value Ref Range    Hepatitis B surf Ab, QT 37.5 Immunity>9.9 mIU/mL             IMPRESSION AND PLAN:  Diagnoses and all orders for this visit:    1. Weakness generalized  -     AMB POC URINALYSIS DIP STICK AUTO W/O MICRO; Future  -     COLLECTION VENOUS BLOOD,VENIPUNCTURE; Future  -     AMB POC HEMOGLOBIN A1C; Future  -     CBC WITH AUTOMATED DIFF; Future  -     METABOLIC PANEL, COMPREHENSIVE; Future  -     LIPID PANEL; Future  -     TSH 3RD GENERATION; Future  -     T4, FREE; Future  -     VITAMIN B12; Future  -     VITAMIN D, 25 HYDROXY; Future    2. 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   Advised her to have blood drawn.  Continue current therapy.  She will have blood drawn here in the office.  Compliance with medication.  All questions answered.  See orders.      Patient is encouraged to engage in moderate physical activity for at least 30 minutes on at least 5 days of the week, and to follow a mediterranean diet rich in whole grains, fruits and vegetables.  The importance of a healthy lifestyle was reviewed            Patient is to continue all other medications as directed by prescribing physicians unless addressed above in plan. Continuation of these medications from today's visit are made based on the patient's report of current medications      Dictated using voice recognition software. Proofread, but unrecognized voice recognition errors may exist.      I was in the office while conducting this encounter.      Patient location : Home          Carlotta Cid is a 23 y.o. female who was evaluated by a video visit encounter for concerns as above. Patient identification was verified prior to start of the visit. A caregiver was present when appropriate. Due to this being a Scientist, physiological (During SWNIO-27 public health emergency), evaluation of the following organ systems was limited: Vitals/Constitutional/EENT/Resp/CV/GI/GU/MS/Neuro/Skin/Heme-Lymph-Imm.  Pursuant to the emergency declaration under the [place], 1135 waiver authority and the [employer] Act, this Virtual  Visit was conducted, with patient's (and/or legal guardian's) consent, to reduce the patient's risk of exposure to COVID-19 and provide necessary medical care.     Services were provided through a video synchronous discussion virtually to substitute for in-person clinic visit.       Zenovia Jarred, MD

## 2018-11-12 ENCOUNTER — Encounter: Admit: 2018-11-12 | Discharge: 2018-11-12 | Payer: PRIVATE HEALTH INSURANCE | Primary: Specialist

## 2018-11-12 LAB — AMB POC URINALYSIS DIP STICK AUTO W/O MICRO
Blood (UA POC): NEGATIVE
Blood (UA POC): NEGATIVE
Creatinine: 200 mg/dL
Glucose (UA POC): NEGATIVE mg/dL
Glucose, Urine, POC: NEGATIVE mg/dL
KETONES, Urine, POC: NEGATIVE
Ketones (UA POC): NEGATIVE
Leukocyte Esterase, Urine, POC: NEGATIVE
Leukocyte esterase (UA POC): NEGATIVE
Nitrite, Urine, POC: NEGATIVE
Nitrites (UA POC): NEGATIVE
PROTEIN-CREATININE RATIO POC: ABNORMAL mg/g
Protein (UA POC): 30
Protein, Urine, POC: 30
Protein-Creat Ratio: ABNORMAL mg/g
Specific Gravity, Urine, POC: 1.015 NA (ref 1.001–1.035)
Specific gravity (UA POC): 1.015 (ref 1.001–1.035)
URINE CREATININE POC: 200 mg/dL
Urobilinogen (UA POC): 0.2 (ref 0.2–1)
Urobilinogen, POC: 0.2 (ref 0.2–1)
pH (UA POC): 8.5 — AB (ref 4.6–8.0)
pH, Urine, POC: 8.5 NA — AB (ref 4.6–8.0)

## 2018-11-12 LAB — AMB POC HEMOGLOBIN A1C
Hemoglobin A1C, POC: 5.1 % (ref 4.6–6.2)
Hemoglobin A1c (POC): 5.1 % (ref 4.6–6.2)

## 2018-11-13 LAB — TSH 3RD GENERATION
TSH: 2.07 u[IU]/mL (ref 0.450–4.500)
TSH: 2.07 u[IU]/mL (ref 0.450–4.500)

## 2018-11-13 LAB — LIPID PANEL
Cholesterol, Total: 256 mg/dL — ABNORMAL HIGH (ref 100–199)
Cholesterol, total: 256 mg/dL — ABNORMAL HIGH (ref 100–199)
HDL Cholesterol: 98 mg/dL (ref >39)
HDL: 98 mg/dL (ref >39)
LDL Calculated: 141 mg/dL — ABNORMAL HIGH (ref 0–99)
LDL, calculated: 141 mg/dL — ABNORMAL HIGH (ref 0–99)
Triglyceride: 87 mg/dL (ref 0–149)
Triglycerides: 87 mg/dL (ref 0–149)
VLDL Cholesterol Calculated: 17 mg/dL (ref 5–40)
VLDL, calculated: 17 mg/dL (ref 5–40)

## 2018-11-13 LAB — COMPREHENSIVE METABOLIC PANEL
ALT: 12 IU/L (ref 0–32)
AST: 22 IU/L (ref 0–40)
Albumin/Globulin Ratio: 2.6 NA — ABNORMAL HIGH (ref 1.2–2.2)
Albumin: 4.9 g/dL (ref 3.9–5.0)
Alkaline Phosphatase: 93 IU/L (ref 39–117)
BUN: 12 mg/dL (ref 6–20)
Bun/Cre Ratio: 15 NA (ref 9–23)
CO2: 25 mmol/L (ref 20–29)
Calcium: 9.3 mg/dL (ref 8.7–10.2)
Chloride: 104 mmol/L (ref 96–106)
Creatinine: 0.82 mg/dL (ref 0.57–1.00)
EGFR IF NonAfrican American: 100 mL/min/{1.73_m2} (ref >59)
GFR African American: 116 mL/min/{1.73_m2} (ref >59)
Globulin, Total: 1.9 g/dL (ref 1.5–4.5)
Glucose: 78 mg/dL (ref 65–99)
Potassium: 4.6 mmol/L (ref 3.5–5.2)
Sodium: 142 mmol/L (ref 134–144)
Total Bilirubin: <0.2 mg/dL (ref 0.0–1.2)
Total Protein: 6.8 g/dL (ref 6.0–8.5)

## 2018-11-13 LAB — T4, FREE
T4 Free: 0.96 ng/dL (ref 0.82–1.77)
T4, Free: 0.96 ng/dL (ref 0.82–1.77)

## 2018-11-13 LAB — CBC WITH AUTO DIFFERENTIAL
Basophils %: 1 %
Basophils Absolute: 0 10*3/uL (ref 0.0–0.2)
Eosinophils %: 3 %
Eosinophils Absolute: 0.2 10*3/uL (ref 0.0–0.4)
Granulocyte Absolute Count: 0 10*3/uL (ref 0.0–0.1)
Hematocrit: 42.4 % (ref 34.0–46.6)
Hemoglobin: 14.7 g/dL (ref 11.1–15.9)
Immature Granulocytes: 0 %
Lymphocytes %: 22 %
Lymphocytes Absolute: 1.4 10*3/uL (ref 0.7–3.1)
MCH: 29.5 pg (ref 26.6–33.0)
MCHC: 34.7 g/dL (ref 31.5–35.7)
MCV: 85 fL (ref 79–97)
Monocytes %: 9 %
Monocytes Absolute: 0.5 10*3/uL (ref 0.1–0.9)
Neutrophils %: 65 %
Neutrophils Absolute: 4.1 10*3/uL (ref 1.4–7.0)
Platelets: 223 10*3/uL (ref 150–450)
RBC: 4.98 x10E6/uL (ref 3.77–5.28)
RDW: 13 % (ref 11.7–15.4)
WBC: 6.3 10*3/uL (ref 3.4–10.8)

## 2018-11-13 LAB — VITAMIN B12
Vitamin B-12: 374 pg/mL (ref 232–1245)
Vitamin B12: 374 pg/mL (ref 232–1245)

## 2018-11-13 LAB — VITAMIN D 25 HYDROXY: Vit D, 25-Hydroxy: 29.4 ng/mL — ABNORMAL LOW (ref 30.0–100.0)

## 2018-11-13 LAB — CBC WITH AUTOMATED DIFF
ABS. BASOPHILS: 0 10*3/uL (ref 0.0–0.2)
ABS. EOSINOPHILS: 0.2 10*3/uL (ref 0.0–0.4)
ABS. IMM. GRANS.: 0 10*3/uL (ref 0.0–0.1)
ABS. MONOCYTES: 0.5 10*3/uL (ref 0.1–0.9)
ABS. NEUTROPHILS: 4.1 10*3/uL (ref 1.4–7.0)
Abs Lymphocytes: 1.4 10*3/uL (ref 0.7–3.1)
BASOPHILS: 1 %
EOSINOPHILS: 3 %
HCT: 42.4 % (ref 34.0–46.6)
HGB: 14.7 g/dL (ref 11.1–15.9)
IMMATURE GRANULOCYTES: 0 %
Lymphocytes: 22 %
MCH: 29.5 pg (ref 26.6–33.0)
MCHC: 34.7 g/dL (ref 31.5–35.7)
MCV: 85 fL (ref 79–97)
MONOCYTES: 9 %
NEUTROPHILS: 65 %
PLATELET: 223 10*3/uL (ref 150–450)
RBC: 4.98 x10E6/uL (ref 3.77–5.28)
RDW: 13 % (ref 11.7–15.4)
WBC: 6.3 10*3/uL (ref 3.4–10.8)

## 2018-11-13 LAB — METABOLIC PANEL, COMPREHENSIVE
A-G Ratio: 2.6 — ABNORMAL HIGH (ref 1.2–2.2)
ALT (SGPT): 12 IU/L (ref 0–32)
AST (SGOT): 22 IU/L (ref 0–40)
Albumin: 4.9 g/dL (ref 3.9–5.0)
Alk. phosphatase: 93 IU/L (ref 39–117)
BUN/Creatinine ratio: 15 (ref 9–23)
BUN: 12 mg/dL (ref 6–20)
Bilirubin, total: <0.2 mg/dL (ref 0.0–1.2)
CO2: 25 mmol/L (ref 20–29)
Calcium: 9.3 mg/dL (ref 8.7–10.2)
Chloride: 104 mmol/L (ref 96–106)
Creatinine: 0.82 mg/dL (ref 0.57–1.00)
GFR est AA: 116 mL/min/{1.73_m2} (ref >59)
GFR est non-AA: 100 mL/min/{1.73_m2} (ref >59)
GLOBULIN, TOTAL: 1.9 g/dL (ref 1.5–4.5)
Glucose: 78 mg/dL (ref 65–99)
Potassium: 4.6 mmol/L (ref 3.5–5.2)
Protein, total: 6.8 g/dL (ref 6.0–8.5)
Sodium: 142 mmol/L (ref 134–144)

## 2018-11-13 LAB — VITAMIN D, 25 HYDROXY: VITAMIN D, 25-HYDROXY: 29.4 ng/mL — ABNORMAL LOW (ref 30.0–100.0)

## 2018-11-13 MED ORDER — CHOLECALCIFEROL (VITAMIN D3) 50,000 UNIT CAPSULE
ORAL_CAPSULE | ORAL | 12 refills | Status: DC
Start: 2018-11-13 — End: 2020-07-21

## 2018-11-13 NOTE — Telephone Encounter (Signed)
Orders Placed This Encounter   ??? cholecalciferol (VITAMIN D3) (50,000 UNITS /1250 MCG) capsule     Sig: Take 1 Cap by mouth every seven (7) days.     Dispense:  12 Cap     Refill:  12     Per DR- notified patient of low Vitamin D level & new Rx, she was directed to take an OTC B-12 supplement and watch diet as her Cholesterol was elevated.

## 2020-06-20 ENCOUNTER — Ambulatory Visit
Admit: 2020-06-20 | Discharge: 2020-06-20 | Payer: PRIVATE HEALTH INSURANCE | Attending: Orthopaedic Surgery | Primary: Specialist

## 2020-06-20 ENCOUNTER — Encounter: Admit: 2020-06-20 | Discharge: 2020-06-20 | Payer: PRIVATE HEALTH INSURANCE | Primary: Specialist

## 2020-06-20 ENCOUNTER — Encounter

## 2020-06-20 ENCOUNTER — Ambulatory Visit: Attending: Orthopaedic Surgery | Primary: Specialist

## 2020-06-20 DIAGNOSIS — M25551 Pain in right hip: Secondary | ICD-10-CM

## 2020-06-20 MED ORDER — DICLOFENAC 75 MG TAB, DELAYED RELEASE
75 mg | ORAL_TABLET | Freq: Two times a day (BID) | ORAL | 1 refills | Status: DC
Start: 2020-06-20 — End: 2020-08-07

## 2020-06-20 MED ORDER — TRAMADOL 50 MG TAB
50 mg | ORAL_TABLET | Freq: Every evening | ORAL | 0 refills | Status: AC | PRN
Start: 2020-06-20 — End: 2020-06-30

## 2020-06-20 NOTE — Telephone Encounter (Signed)
 Requested Prescriptions     Pending Prescriptions Disp Refills   . traMADoL (ULTRAM) 50 mg tablet 20 Tablet 0     Sig: Take 1-2 Tablets by mouth nightly as needed for Pain for up to 10 days. Max Daily Amount: 100 mg.

## 2020-06-20 NOTE — Telephone Encounter (Signed)
Patient wants to know instead of [facility] if her order for I believe she said mri can go to somewhere in [place] or [place] since that is where she goes to school?

## 2020-06-20 NOTE — Progress Notes (Signed)
Progress  Notes by Allyne GeeKoch, Dao Memmott Shay, MD at 06/20/20 1245                Author: Allyne GeeKoch, Ferman Basilio Shay, MD  Service: --  Author Type: Physician       Filed: 06/20/20 1257  Encounter Date: 06/20/2020  Status: Signed          Editor: Allyne GeeKoch, Aleenah Homen Shay, MD (Physician)                       Name: Brooke Graham   Date of Birth: [birth year]   Gender: female   MRN: [id number]      CC:      Chief Complaint       Patient presents with        ?  Hip Pain             right     Right hip pain.      HPI: Patient presents today with right hip pain.  The patient has previously seen Dr. Alycia RossettiKoch for left hip pain and had surgical intervention  back in 2017.  She notes approximately 5 weeks ago she was doing rowing exercises when she had onset of hip pain.  She tried resting and now she cannot even walk or stand without pain.  She notes she has to sit with her hip extended in order to decrease  pain.  She denies numbness, tingling, interference with sleep.  She has not had any other treatment on this hip.  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  She notes it feels like it will pinch.  Denies numbness or tingling.  Medrol Dosepak did not  seem to help and cause side effects.  Feels like when she was doing a lot of rowing it really started to aggravate it.   No other complaints.  No other issues.         Current Outpatient Medications          Medication  Sig  Dispense  Refill           ?  diclofenac EC (VOLTAREN) 75 mg EC tablet  Take 1 Tablet by mouth two (2) times a day.  28 Tablet  1     ?  cholecalciferol (VITAMIN D3) (50,000 UNITS /1250 MCG) capsule  Take 1 Cap by mouth every seven (7) days.  12 Cap  12           ?  Lisdexamfetamine (VYVANSE) 40 mg capsule  Take  by mouth daily.             No Known Allergies     Past Medical History:        Diagnosis  Date         ?  Left hip pain             Past Surgical History:         Procedure  Laterality  Date          ?  IR CHANGE DRAIN GU ABCESS PERC               Family History         Problem   Relation  Age of Onset          ?  Elevated Lipids  Father            ?  Hypertension  Mother  Social History          Socioeconomic History         ?  Marital status:  SINGLE              Spouse name:  Not on file         ?  Number of children:  Not on file     ?  Years of education:  Not on file     ?  Highest education level:  Not on file       Occupational History        ?  Not on file       Tobacco Use         ?  Smoking status:  Never Smoker     ?  Smokeless tobacco:  Never Used       Substance and Sexual Activity         ?  Alcohol use:  Yes     ?  Drug use:  No     ?  Sexual activity:  Not on file        Other Topics  Concern        ?  Not on file       Social History Narrative        ?  Not on file          Social Determinants of Health          Financial Resource Strain:         ?  Difficulty of Paying Living Expenses: Not on file       Food Insecurity:         ?  Worried About Running Out of Food in the Last Year: Not on file     ?  Ran Out of Food in the Last Year: Not on file       Transportation Needs:         ?  Lack of Transportation (Medical): Not on file     ?  Lack of Transportation (Non-Medical): Not on file       Physical Activity:         ?  Days of Exercise per Week: Not on file     ?  Minutes of Exercise per Session: Not on file       Stress:         ?  Feeling of Stress : Not on file       Social Connections:         ?  Frequency of Communication with Friends and Family: Not on file     ?  Frequency of Social Gatherings with Friends and Family: Not on file     ?  Attends Religious Services: Not on file     ?  Active Member of Clubs or Organizations: Not on file     ?  Attends Banker Meetings: Not on file     ?  Marital Status: Not on file       Intimate Partner Violence:         ?  Fear of Current or Ex-Partner: Not on file     ?  Emotionally Abused: Not on file     ?  Physically Abused: Not on file     ?  Sexually Abused: Not on file       Housing Stability:          ?  Unable to Pay for Housing in the Last Year: Not on file     ?  Number of Places Lived in the Last Year: Not on file        ?  Unstable Housing in the Last Year: Not on file            Review of Systems   Per POA Patient Health Form which has been filled out, reviewed,  signed by both the patient and myself, and included in the chart on today's date.        PE:     General: WDWN patient in NAD.   HEENT:  Normocephalic, atraumatic.  Extraocular muscles appear intact.   Neck:  Supple, FROM.    Heart:  Regular on pulsatile exam on all extremities.  Good color and warmth noted.   Lungs:  Normal non-labored breathing with no obvious shortness of breath.   Abdomen:  WNL's.   Skin:  No signs of rash or bruising.     Psych:  Answers questions appropriately, AO X 3.       Focused MSK Evaluation Back and Lower Extremities:    The patient has normal lumbar extension and flexion. No tenderness to palpation over the lumbar spine.     They can perform toe raise and heel raise without any abnormalities. Single leg stance is normal.    On supine tabletop evaluation, log roll is normal.  Passive straight leg raise is normal on the left and normal on the right.    Strength is normal with straight leg raise and abduction of the hip.       Right hip: Hip flexion is 90 with groin pain. Abduction is 20 without pain.    At 90 degrees of hip flexion, internal rotation is 10-15 with groin pain. External rotation is 45 without groin pain. Impingement sign (hip flexion, internal rotation, adduction) +. Faber's is normal. Palpation over the greater trochanter causes no pain.   Mild tenderness over the anterior groin distal to the ASIS.  Sensation is intact to light touch in superficial and deep peroneal nerves and posterior tibial nerve. Normal 2+ dorsalis pedis and posterior tibial pulses. Strength of the leg with knee extension  and flexion as well as ankle strength is normal throughout.  Scours is positive.      The left hip is further  evaluated.  Hip flexion is 130 degrees, no pain.  Abduction 25-30 degrees with no pain.  At 90 degrees of hip flexion, internal rotation is without pain. External rotation is without pain. Faber's is negative.  Impingement sign  is negative.  Normal neurovascular examination throughout to light touch and pulsatile evaluation.       Radiographic assessment:  An AP pelvis, a Dunn view lateral and false profile view of the Right hip were obtained and reviewed today in the  office.    On the AP view, Tonnis Grade of 1. Positive crossover sign. The hip shows no signs of increased depth.  Neutral sourcil is noted.   Dunn view lateral: Mildly positive Cam deformity. There is a little sclerotic area at the head neck junction anteriorly.  False profile:  shows normal joint space.  Mild signs of AIIS prominence.   No signs of early AVN, fracture or other abnormality noted.      Impression:   Right hip FAI impingement      Assessment/plan:               ICD-10-CM  ICD-9-CM             1.  Right hip pain   M25.551  719.45  XR HIP RT W OR WO PELV 2-3 VWS                MRI HIP RT W CONT           REFERRAL TO PHYSICAL THERAPY           2.  Tear of right acetabular labrum, initial encounter   S73.191A  843.8  MRI HIP RT W CONT                REFERRAL TO PHYSICAL THERAPY           3.  Other articular cartilage disorders, right hip   M24.151  718.05  REFERRAL TO PHYSICAL THERAPY           4.  Femoroacetabular impingement of right hip   M25.851  719.95          Right hip pain,  FAI, and Labral tear.   I discussed with the patient the findings on the x-rays, as well as the exam.      Given the extensive history/chronicity and severity of the pain, they would like to proceed with obtaining an MRI arthrogram of the hip to evaluate for any intraarticular pathology such as labral tears, chondral injury and loose bodies.  They understand  that depending upon the results of this, we may continued with further conservative treatment, such as  formal therapy versus proceeding with further diagnostic testing, such as intraarticular injection and 3D CT reconstruction. The patient is amenable  to this plan.   I discussed activity modification in the meantime, home exercises and the use of anti-inflammatories. They understand the risks and benefits of these treatments.   I gave them information from the [facility] on FAI.   Patient prescribed with Non-steroidal anti-inflammatories after review of risks and benefits. We discussed additional activity modification to help reduce pain for initial conservative  treatment.  Diclofenac 75 mg   Short course of tramadol to use at night.   Recommend follow-up with Dr. Claudette Laws I will reach out to him and his staff to set this up with rescheduling that as well.      No results found for this visit on 06/20/20.     Follow-up and Dispositions      ??  Return with Dr. Earle Gell, for after MRI.                 Allyne Gee, MD   06/20/20

## 2020-06-20 NOTE — Telephone Encounter (Signed)
Spoke with patient, advise her she can take her MR arthrogram order to any facility she'd like.  Advised if it's a facility that uses epic they should be able to see the order through care everywhere but if not, I can fax it wherever she would like.  Patient voiced understanding.

## 2020-06-22 ENCOUNTER — Encounter

## 2020-06-22 NOTE — Telephone Encounter (Signed)
Spoke with [name], she wanted to know if our office is obtaining the precert or if they need to do that.  I advised her it'smy understanding that the office performing the procedure is responsible for getting the prior auth.  She also states the order they have is for an MRI.  I put the new order in and made sure it's labeled arthrogram.  I will fax the new order to [facility].  New order faxed last Friday.

## 2020-06-22 NOTE — Telephone Encounter (Signed)
[name] called from [facility] about this mri  For this patient please called [phone]

## 2020-07-03 ENCOUNTER — Encounter

## 2020-07-03 NOTE — Telephone Encounter (Signed)
Is checking on MRI results pat# [phone] (Dr Leo Rod daughter)

## 2020-07-03 NOTE — Telephone Encounter (Signed)
Per Dr. Alycia Rossetti he will call patient with results.

## 2020-07-04 NOTE — Telephone Encounter (Signed)
I spoke with Brooke Graham and she has been scheduled for Thursday to come for an evaluation with Dr. Claudette Laws.

## 2020-07-04 NOTE — Telephone Encounter (Signed)
Pt called and wants to schedule surgery with Dr Claudette Laws  Pt is Brooke Graham Daughter

## 2020-07-06 ENCOUNTER — Encounter

## 2020-07-06 ENCOUNTER — Ambulatory Visit
Admit: 2020-07-06 | Discharge: 2020-07-06 | Payer: BLUE CROSS/BLUE SHIELD | Attending: Orthopaedic Surgery | Primary: Specialist

## 2020-07-06 ENCOUNTER — Ambulatory Visit: Attending: Orthopaedic Surgery | Primary: Specialist

## 2020-07-06 DIAGNOSIS — M25851 Other specified joint disorders, right hip: Secondary | ICD-10-CM

## 2020-07-06 MED ORDER — METHYLPREDNISOLONE 4 MG TABS IN A DOSE PACK
4 mg | ORAL | 0 refills | Status: DC
Start: 2020-07-06 — End: 2020-07-21

## 2020-07-06 NOTE — Progress Notes (Signed)
Progress Notes by Jeb Levering, MD at 07/06/20 0745                Author: Jeb Levering, MD  Service: --  Author Type: Physician       Filed: 07/06/20 1303  Encounter Date: 07/06/2020  Status: Signed          Editor: Jeb Levering, MD (Physician)                       Name: Brooke Graham   Date of Birth: 1996-02-25   Gender: female   MRN: [id number]         CC: Hip Pain (right hip)          HPI: Brooke Graham  is a 25 y.o. female who presents  with Hip Pain (right hip)   . Brooke Graham is a new patient to me, here for right hip pain. She reports right hip pain beginning about a month ago that she states may have started after doing a rowing exercise. She  was evaluated by Dr. Alycia Rossetti and an MRI was ordered with [facility]. Of note she has had a left hip labral repair in June 2017 with Dr. Alycia Rossetti. Her right hip pain has gotten bad enough that she has had to ambulate with crutches recently. 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  Below are the pertinents      Tobacco:  reports that she has never smoked. She has never used smokeless tobacco.   Diabetes: None   Other: none      Physical Examination:   General: no acute distress   Lungs: breathing easily   CV: regular rhythm by pulse   Right Hip: Tenderness palpation of the anterolateral hip she has pain and discomfort with logroll internal and external rotation. Tolerates flexion up to about 90 degrees limited by pain and spasm. Internal rotation about 10 to 15 degrees with positive  FADIR impingement external out to only 30 with positive FABER. Pain with recreation of symptoms with Darden Palmer. She has appropriate resisted abduction and abduction strength although weak abductors and glutes. Motor and sensory intact distally with  a  negative straight leg raise.         Imaging:    I reviewed her plain radiographs from previous visits which demonstrate a crossover sign and coxa profunda morphology with lateral center edge angle about 43 degrees. Mild cam deformity with loss of  offset on the Dunn. Also reviewed an MRI scan from [facility] which demonstrates anterior superior acetabular labral tearing as well as some chondromalacia posteriorly.   All imaging interpreted by myself Timm Bonenberger T. Claudette Laws, MD independent of radiology review            Assessment:             ICD-10-CM  ICD-9-CM          1.  Femoroacetabular impingement of right hip   M25.851  719.95          2.  Tear of right acetabular labrum, initial encounter   S73.191A  843.8            Plan:    FAI likely combined  cam and pincer with labral pathology. We discussed an intra-articular injection physical therapy as well as hip arthroscopy. We are going to place her on a Medrol Dosepak for her acute inflammatory symptoms. Also will enroll her with  physical therapy in [place] for treatment of her acute symptoms. She is very interested in surgery she is done well from this on the contralateral side I think this is reasonable.   We discussed the details risks and benefits of hip arthroscopy to include cam and rim osteoplasty and labral repair versus debridement.  We reviewed the risk of surgery including but not limited to anesthetic complications, infection, postoperative DVT/PE,  postoperative stiffness, lateral femoral cutaneous nerve palsy, pudendal nerve palsy, heterotopic ossification as well as continued hip pain and possible need for further surgery in the future. We also reviewed the postoperative course including extensive  physical therapy and a likely 5 to 69-month full recovery period.  The patient expressed understanding and elected to proceed as planned.      I would like to obtain a frog lateral radiograph of her right hip at her preop appointment      Follow up              Vannesa Abair T. Claudette Laws MD, FAAOS   Orthopaedics and Sports Medicine

## 2020-07-19 NOTE — Interval H&P Note (Signed)
Patient verified name and DOB.    Order for consent not found in EHR and  patient verifies procedure.     Type 2 surgery, phone assessment complete.  Orders not received.    Labs per surgeon: none at present time  Labs per anesthesia protocol: hgb DOS    Patient COVID test not scheduled. Per note physician is requesting to be done DOS since patient lives out of town       Patient answered medical/surgical history questions at their best of ability. All prior to admission medications documented in [facility].    Patient instructed to take the following medications the day of surgery according to anesthesia guidelines with a small sip of water: Vyvanse     On the day before surgery please take Acetaminophen 1000mg  in the morning and then again before bed. You may substitute for Tylenol 650 mg.       Hold all vitamins, Vitamin D, 7 days prior to surgery and NSAIDS,Diclofenac, 5 days prior to surgery. Prescription meds to hold: none DOS        Patient instructed on the following:    > Arrive at [facility] Entr[facility], time of arrival to be called the day before by 1700  > NPO after midnight including gum, mints, and ice chips  > Responsible adult must drive patient to the hospital, stay during surgery, and patient will need supervision 24 hours after anesthesia  > Use antibacterial soap and hibiclens  in shower the night before surgery and on the morning of surgery  > All piercings must be removed prior to arrival.    > Leave all valuables (money and jewelry) at home but bring insurance card and ID on DOS.   > You may be required to pay a deductible or co-pay on the day of your procedure. You can pre-pay by calling [phone] if your surgery is at the [facility] or [phone] if your surgery is at the [facility].  > Do not wear make-up, nail polish, lotions, cologne, perfumes, powders, or oil on skin. Artificial nails are not permitted.

## 2020-07-20 ENCOUNTER — Inpatient Hospital Stay: Primary: Specialist

## 2020-07-25 ENCOUNTER — Encounter: Admit: 2020-07-25 | Discharge: 2020-07-25 | Payer: BLUE CROSS/BLUE SHIELD | Primary: Specialist

## 2020-07-25 ENCOUNTER — Ambulatory Visit
Admit: 2020-07-25 | Discharge: 2020-07-25 | Payer: BLUE CROSS/BLUE SHIELD | Attending: Orthopaedic Surgery | Primary: Specialist

## 2020-07-25 ENCOUNTER — Ambulatory Visit: Attending: Orthopaedic Surgery | Primary: Specialist

## 2020-07-25 DIAGNOSIS — M25851 Other specified joint disorders, right hip: Secondary | ICD-10-CM

## 2020-07-25 MED ORDER — OXYCODONE 5 MG TAB
5 mg | ORAL_TABLET | ORAL | 0 refills | Status: AC | PRN
Start: 2020-07-25 — End: 2020-08-08

## 2020-07-25 NOTE — Progress Notes (Signed)
Progress Notes by Jeb Levering, MD at 07/25/20 1500                Author: Jeb Levering, MD  Service: --  Author Type: Physician       Filed: 07/25/20 1554  Encounter Date: 07/25/2020  Status: Signed          Editor: Jeb Levering, MD (Physician)                       Name: Brooke Graham   Date of Birth: 1995-10-22   Gender: female   MRN: [id number]         CC: Hip Pain (R hip pre-op)          HPI: Brooke Graham is a  25 y.o. female who returns for follow up on her R hip for a pre-op appointment.  She is  scheduled for a R femoroplasty, acetabuloplasty, and labral repair on 07/26/2020.         Current Outpatient Medications:    ?  cholecalciferol (Vitamin D3) (1000 Units /25 mcg) tablet, Take  by mouth daily., Disp: , Rfl:    ?  diclofenac EC (VOLTAREN) 75 mg EC tablet, Take 1 Tablet by mouth two (2) times a day., Disp: 28 Tablet, Rfl: 1   ?  Lisdexamfetamine (VYVANSE) 40 mg capsule, Take  by mouth daily., Disp: , Rfl:    No Known Allergies     Past Medical History:        Diagnosis  Date         ?  Left hip pain       ?  Nausea & vomiting            PONV          Past Surgical History:         Procedure  Laterality  Date          ?  HX ORTHOPAEDIC  Left  01/02/2015          hip surgery           ?  HX TONSILLECTOMY              2021          ?  IR CHANGE DRAIN GU ABCESS PERC              Family History         Problem  Relation  Age of Onset          ?  Elevated Lipids  Father            ?  Hypertension  Mother            Social History          Socioeconomic History         ?  Marital status:  SINGLE              Spouse name:  Not on file         ?  Number of children:  Not on file     ?  Years of education:  Not on file     ?  Highest education level:  Not on file       Occupational History        ?  Not on file       Tobacco Use         ?  Smoking  status:  Never Smoker     ?  Smokeless tobacco:  Never Used       Substance and Sexual Activity         ?  Alcohol use:  Yes             Comment: socially          ?  Drug use:  No     ?  Sexual activity:  Not on file        Other Topics  Concern        ?  Not on file       Social History Narrative        ?  Not on file          Social Determinants of Health          Financial Resource Strain:         ?  Difficulty of Paying Living Expenses: Not on file       Food Insecurity:         ?  Worried About Running Out of Food in the Last Year: Not on file     ?  Ran Out of Food in the Last Year: Not on file       Transportation Needs:         ?  Lack of Transportation (Medical): Not on file     ?  Lack of Transportation (Non-Medical): Not on file       Physical Activity:         ?  Days of Exercise per Week: Not on file     ?  Minutes of Exercise per Session: Not on file       Stress:         ?  Feeling of Stress : Not on file       Social Connections:         ?  Frequency of Communication with Friends and Family: Not on file     ?  Frequency of Social Gatherings with Friends and Family: Not on file     ?  Attends Religious Services: Not on file     ?  Active Member of Clubs or Organizations: Not on file     ?  Attends Banker Meetings: Not on file     ?  Marital Status: Not on file       Intimate Partner Violence:         ?  Fear of Current or Ex-Partner: Not on file     ?  Emotionally Abused: Not on file     ?  Physically Abused: Not on file     ?  Sexually Abused: Not on file       Housing Stability:         ?  Unable to Pay for Housing in the Last Year: Not on file     ?  Number of Places Lived in the Last Year: Not on file        ?  Unstable Housing in the Last Year: Not on file           Physical Examination:   General: no acute distress   Lungs: breathing easily   CV: regular rhythm by pulse   Right Hip: Right hip exam is unchanged continued pain with impingement testing and Stinchfield.      Imaging:     Hip/pelvis XR: FAI 2 views  Clinical Indication   1.  Femoroacetabular impingement of right hip       2.  Tear of right acetabular labrum, subsequent  encounter       3.  Right hip pain                      Report: dunn and frog lateral, x-ray of the Right hip demonstrates mild cam deformity of the head neck junction with loss of offset      Impression: FAI as above, no acute findings           Jeb Levering, MD                    Assessment:             ICD-10-CM  ICD-9-CM          1.  Femoroacetabular impingement of right hip   M25.851  719.95     2.  Tear of right acetabular labrum, subsequent encounter   S73.191D  V58.89             843.8          3.  Right hip pain   M25.551  719.45            Plan:    We discussed the details risks and benefits of hip arthroscopy to include cam and rim osteoplasty and labral repair versus debridement.  We reviewed the risk of surgery including but not limited to anesthetic complications, infection, postoperative DVT/PE,  postoperative stiffness, lateral femoral cutaneous nerve palsy, pudendal nerve palsy, heterotopic ossification as well as continued hip pain and possible need for further surgery in the future. We also reviewed the postoperative course including extensive  physical therapy and a likely 5 to 74-month full recovery period.  The patient expressed understanding and elected to proceed as planned, informed consent was obtained.      Surgical plan to be for right hip arthroscopy cam and rim osteoplasty labral repair and possible biocartilage to the acetabulum      Follow up             Grason Brailsford T. Claudette Laws MD, FAAOS   Orthopaedics and Sports Medicine

## 2020-07-25 NOTE — Addendum Note (Signed)
Addendum  Note by Jeb Levering, MD at 07/25/20 1500                Author: Jeb Levering, MD  Service: --  Author Type: Physician       Filed: 07/25/20 1559  Encounter Date: 07/25/2020  Status: Signed          Editor: Jeb Levering, MD (Physician)          Addended by: Earle Gell T on: 07/25/2020 03:59 PM    Modules accepted: Orders

## 2020-07-26 ENCOUNTER — Inpatient Hospital Stay: Payer: BLUE CROSS/BLUE SHIELD

## 2020-07-26 ENCOUNTER — Ambulatory Visit: Payer: BLUE CROSS/BLUE SHIELD | Primary: Specialist

## 2020-07-26 LAB — COVID-19, RAPID: SARS-CoV-2, Rapid: DETECTED — CR

## 2020-07-26 LAB — HCG URINE, QL. - POC
HCG, Pregnancy, Urine, POC: NEGATIVE
Pregnancy test,urine (POC): NEGATIVE

## 2020-07-26 LAB — COVID-19

## 2020-07-26 LAB — HEMOGLOBIN
HGB: 14 g/dL (ref 11.7–15.4)
Hemoglobin: 14 g/dL (ref 11.7–15.4)

## 2020-07-26 LAB — SARS-COV-2

## 2020-07-26 LAB — COVID-19 RAPID TEST: COVID-19 rapid test: DETECTED — CR

## 2020-07-26 MED ORDER — SODIUM CHLORIDE 0.9 % IJ SYRG
Freq: Three times a day (TID) | INTRAMUSCULAR | Status: DC
Start: 2020-07-26 — End: 2020-08-01

## 2020-07-26 MED ORDER — LACTATED RINGERS IV
INTRAVENOUS | Status: DC
Start: 2020-07-26 — End: 2020-07-27
  Administered 2020-07-26: 12:00:00 via INTRAVENOUS

## 2020-07-26 MED ORDER — LIDOCAINE HCL 1 % (10 MG/ML) IJ SOLN
10 mg/mL (1 %) | INTRAMUSCULAR | Status: DC | PRN
Start: 2020-07-26 — End: 2020-08-01

## 2020-07-26 MED ORDER — MIDAZOLAM 1 MG/ML IJ SOLN
1 mg/mL | Freq: Once | INTRAMUSCULAR | Status: DC | PRN
Start: 2020-07-26 — End: 2020-07-27

## 2020-07-26 MED ORDER — CEFAZOLIN 2 GRAM/20 ML IN STERILE WATER INTRAVENOUS SYRINGE
2 gram/0 mL | Freq: Once | INTRAVENOUS | Status: DC
Start: 2020-07-26 — End: 2020-07-26

## 2020-07-26 MED ORDER — MIDAZOLAM 1 MG/ML IJ SOLN
1 mg/mL | Freq: Once | INTRAMUSCULAR | Status: DC
Start: 2020-07-26 — End: 2020-07-26

## 2020-07-26 MED ORDER — SODIUM CHLORIDE 0.9 % IJ SYRG
INTRAMUSCULAR | Status: DC | PRN
Start: 2020-07-26 — End: 2020-08-01

## 2020-07-26 MED ORDER — ACETAMINOPHEN 500 MG TAB
500 mg | Freq: Once | ORAL | Status: AC
Start: 2020-07-26 — End: 2020-07-26
  Administered 2020-07-26: 12:00:00 via ORAL

## 2020-07-26 MED ORDER — FENTANYL CITRATE (PF) 50 MCG/ML IJ SOLN
50 mcg/mL | Freq: Once | INTRAMUSCULAR | Status: DC
Start: 2020-07-26 — End: 2020-07-26

## 2020-07-26 MED FILL — TYLENOL EXTRA STRENGTH 500 MG TABLET: 500 mg | ORAL | Qty: 2

## 2020-07-26 MED FILL — CEFAZOLIN 2 GRAM/20 ML IN STERILE WATER INTRAVENOUS SYRINGE: 2 gram/0 mL | INTRAVENOUS | Qty: 20

## 2020-07-26 NOTE — Interval H&P Note (Signed)
Patient tested positive 07/03/20, symptoms headache and sore throat.    4 weeks has passed since initial infection.

## 2020-07-26 NOTE — Interval H&P Note (Signed)
 Updated pre-assessment by phone with the patient due to rescheduled surgery. Pt states that surgery was rescheduled due to testing positive for covid  . Pt denies any recent changes in health history or medications since original pre-assessment on 07/19/2020 Pt has all original surgery / pre-op instruction sheets and verbalizes understanding of all instructions.  If you have not received your arrival time by 5 pm the business day before surgery, Please call [phone].  NPO p MN including water, gum, candy, mints & tobacco. Take only  Vyvanse with sips of water on the day of surgery. Tylenol 2 tabs this am and pm.Pt has continued to hold the following medications all vitamins,supplements and nsaids   Pt instructed to brush teeth. Shower usingantibacterial soap  andHibiclens on the night before and morning of surgery. If using Hibiclens, spread it from the neck down, avoiding genitalia. Leave on for 60 seconds, then rinse thouroughly. Do not apply ANY makeup, lotion, powder, moisturizers, cologne, aftershave, nail polish or hair products. Leave all jewelry and valuables at home. Sleep on freshly laundered linens and wear freshly laundered clothing that is comfortable and loose fitting. You must have a responsible adult to drive you to the hospital, remain during the entire procedure, and drive you home, and remain with you for 24 hours. If you have any questions call [phone].     Patient verbalizes understanding to if not notified of appointment for COVID-19 swab within the 4 business days prior to surgery, call [phone]. Pt verbalizes understanding of 1 visitor policy during this time.      You may be required to pay a deductible or co-pay on the day of your procedure. You can pre-pay by calling [phone] if your surgery is at the [facility] or [phone] if your surgery is at the [facility].  Labs / orders none per protocol

## 2020-07-31 ENCOUNTER — Encounter

## 2020-08-01 ENCOUNTER — Inpatient Hospital Stay: Primary: Specialist

## 2020-08-03 ENCOUNTER — Inpatient Hospital Stay: Payer: BLUE CROSS/BLUE SHIELD

## 2020-08-03 ENCOUNTER — Ambulatory Visit: Admit: 2020-08-03 | Payer: BLUE CROSS/BLUE SHIELD | Primary: Specialist

## 2020-08-03 MED ORDER — OXYCODONE 5 MG TAB
5 mg | Freq: Once | ORAL | Status: DC | PRN
Start: 2020-08-03 — End: 2020-08-03

## 2020-08-03 MED ORDER — CEFAZOLIN 2 GRAM/20 ML IN STERILE WATER INTRAVENOUS SYRINGE
2 gram/0 mL | Freq: Once | INTRAVENOUS | Status: AC
Start: 2020-08-03 — End: 2020-08-03
  Administered 2020-08-03: 20:00:00 via INTRAVENOUS

## 2020-08-03 MED ORDER — HYDROMORPHONE 2 MG/ML INJECTION SOLUTION
2 mg/mL | INTRAMUSCULAR | Status: DC | PRN
Start: 2020-08-03 — End: 2020-08-03
  Administered 2020-08-03 (×3): via INTRAVENOUS

## 2020-08-03 MED ORDER — APREPITANT 40 MG CAP
40 mg | Freq: Once | ORAL | Status: AC
Start: 2020-08-03 — End: 2020-08-03
  Administered 2020-08-03: 19:00:00 via ORAL

## 2020-08-03 MED ORDER — MIDAZOLAM 1 MG/ML IJ SOLN
1 mg/mL | Freq: Once | INTRAMUSCULAR | Status: DC | PRN
Start: 2020-08-03 — End: 2020-08-03

## 2020-08-03 MED ORDER — SODIUM CHLORIDE 0.9 % IV
INTRAVENOUS | Status: AC | PRN
Start: 2020-08-03 — End: 2020-08-03
  Administered 2020-08-03: 21:00:00

## 2020-08-03 MED ORDER — NEOSTIGMINE METHYLSULFATE 3 MG/3 ML (1 MG/ML) IV SYRINGE
3 mg/ mL (1 mg/mL) | INTRAVENOUS | Status: DC | PRN
Start: 2020-08-03 — End: 2020-08-03
  Administered 2020-08-03: 21:00:00 via INTRAVENOUS

## 2020-08-03 MED ORDER — LIDOCAINE (PF) 20 MG/ML (2 %) IJ SOLN
20 mg/mL (2 %) | INTRAMUSCULAR | Status: DC | PRN
Start: 2020-08-03 — End: 2020-08-03
  Administered 2020-08-03: 19:00:00 via INTRAVENOUS

## 2020-08-03 MED ORDER — KETOROLAC TROMETHAMINE 30 MG/ML INJECTION
30 mg/mL (1 mL) | INTRAMUSCULAR | Status: DC | PRN
Start: 2020-08-03 — End: 2020-08-03
  Administered 2020-08-03: 21:00:00

## 2020-08-03 MED ORDER — EPINEPHRINE 1 MG/ML IJ SOLN
1 mg/mL | INTRAMUSCULAR | Status: DC | PRN
Start: 2020-08-03 — End: 2020-08-03
  Administered 2020-08-03: 21:00:00

## 2020-08-03 MED ORDER — DEXAMETHASONE SODIUM PHOSPHATE 4 MG/ML IJ SOLN
4 mg/mL | INTRAMUSCULAR | Status: DC | PRN
Start: 2020-08-03 — End: 2020-08-03
  Administered 2020-08-03: 20:00:00 via INTRAVENOUS

## 2020-08-03 MED ORDER — ROCURONIUM 10 MG/ML IV
10 mg/mL | INTRAVENOUS | Status: DC | PRN
Start: 2020-08-03 — End: 2020-08-03
  Administered 2020-08-03 (×2): via INTRAVENOUS

## 2020-08-03 MED ORDER — LACTATED RINGERS IV
INTRAVENOUS | Status: DC
Start: 2020-08-03 — End: 2020-08-03

## 2020-08-03 MED ORDER — GLYCOPYRROLATE 0.2 MG/ML IJ SOLN
0.2 mg/mL | INTRAMUSCULAR | Status: DC | PRN
Start: 2020-08-03 — End: 2020-08-03
  Administered 2020-08-03: 21:00:00 via INTRAVENOUS

## 2020-08-03 MED ORDER — ROPIVACAINE (PF) 5 MG/ML (0.5 %) INJECTION
5 mg/mL (0. %) | INTRAMUSCULAR | Status: AC
Start: 2020-08-03 — End: ?

## 2020-08-03 MED ORDER — SODIUM CHLORIDE 0.9 % INJECTION
25 mg/mL | INTRAMUSCULAR | Status: AC | PRN
Start: 2020-08-03 — End: 2020-08-03
  Administered 2020-08-03 (×2): via INTRAVENOUS

## 2020-08-03 MED ORDER — ACETAMINOPHEN 500 MG TAB
500 mg | Freq: Once | ORAL | Status: AC
Start: 2020-08-03 — End: 2020-08-03
  Administered 2020-08-03: 19:00:00 via ORAL

## 2020-08-03 MED ORDER — ROPIVACAINE (PF) 5 MG/ML (0.5 %) INJECTION
5 mg/mL (0. %) | INTRAMUSCULAR | Status: DC | PRN
Start: 2020-08-03 — End: 2020-08-03
  Administered 2020-08-03: 21:00:00

## 2020-08-03 MED ORDER — MIDAZOLAM 1 MG/ML IJ SOLN
1 mg/mL | INTRAMUSCULAR | Status: DC | PRN
Start: 2020-08-03 — End: 2020-08-03
  Administered 2020-08-03: 19:00:00 via INTRAVENOUS

## 2020-08-03 MED ORDER — PROPOFOL 10 MG/ML IV EMUL
10 mg/mL | INTRAVENOUS | Status: DC | PRN
Start: 2020-08-03 — End: 2020-08-03
  Administered 2020-08-03 (×2): via INTRAVENOUS

## 2020-08-03 MED ORDER — LACTATED RINGERS IV
INTRAVENOUS | Status: DC
Start: 2020-08-03 — End: 2020-08-03
  Administered 2020-08-03 (×2): via INTRAVENOUS

## 2020-08-03 MED ORDER — LIDOCAINE HCL 1 % (10 MG/ML) IJ SOLN
10 mg/mL (1 %) | INTRAMUSCULAR | Status: DC | PRN
Start: 2020-08-03 — End: 2020-08-03

## 2020-08-03 MED ORDER — NALOXONE 0.4 MG/ML INJECTION
0.4 mg/mL | INTRAMUSCULAR | Status: DC | PRN
Start: 2020-08-03 — End: 2020-08-03

## 2020-08-03 MED ORDER — HYDROMORPHONE 2 MG/ML INJECTION SOLUTION
2 mg/mL | INTRAMUSCULAR | Status: AC
Start: 2020-08-03 — End: ?

## 2020-08-03 MED ORDER — EPINEPHRINE (PF) 1 MG/ML INJECTION
1 mg/mL ( mL) | INTRAMUSCULAR | Status: AC
Start: 2020-08-03 — End: ?

## 2020-08-03 MED ORDER — MIDAZOLAM 1 MG/ML IJ SOLN
1 mg/mL | INTRAMUSCULAR | Status: AC
Start: 2020-08-03 — End: ?

## 2020-08-03 MED ORDER — FENTANYL CITRATE (PF) 50 MCG/ML IJ SOLN
50 mcg/mL | Freq: Once | INTRAMUSCULAR | Status: DC
Start: 2020-08-03 — End: 2020-08-03

## 2020-08-03 MED ORDER — FENTANYL CITRATE (PF) 50 MCG/ML IJ SOLN
50 mcg/mL | INTRAMUSCULAR | Status: AC
Start: 2020-08-03 — End: ?

## 2020-08-03 MED ORDER — CELECOXIB 200 MG CAP
200 mg | Freq: Once | ORAL | Status: AC
Start: 2020-08-03 — End: 2020-08-03
  Administered 2020-08-03: 19:00:00 via ORAL

## 2020-08-03 MED ORDER — SCOPOLAMINE (1.3-1.5) MG 72 HR TRANSDERM PATCH
1 mg over 3 days | Freq: Once | TRANSDERMAL | Status: DC
Start: 2020-08-03 — End: 2020-08-03

## 2020-08-03 MED ORDER — ONDANSETRON (PF) 4 MG/2 ML INJECTION
4 mg/2 mL | INTRAMUSCULAR | Status: DC | PRN
Start: 2020-08-03 — End: 2020-08-03
  Administered 2020-08-03: 20:00:00 via INTRAVENOUS

## 2020-08-03 MED ORDER — TRANEXAMIC ACID 1,000 MG/10 ML (100 MG/ML) IV
1000 mg/10 mL (100 mg/mL) | INTRAVENOUS | Status: DC | PRN
Start: 2020-08-03 — End: 2020-08-03
  Administered 2020-08-03: 20:00:00 via INTRAVENOUS

## 2020-08-03 MED ORDER — TRANEXAMIC ACID 1,000 MG/10 ML (100 MG/ML) IV
1000 mg/10 mL (100 mg/mL) | INTRAVENOUS | Status: AC
Start: 2020-08-03 — End: ?

## 2020-08-03 MED ORDER — MIDAZOLAM 1 MG/ML IJ SOLN
1 mg/mL | Freq: Once | INTRAMUSCULAR | Status: DC
Start: 2020-08-03 — End: 2020-08-03

## 2020-08-03 MED ORDER — HYDROMORPHONE (PF) 2 MG/ML IJ SOLN
2 mg/mL | INTRAMUSCULAR | Status: DC | PRN
Start: 2020-08-03 — End: 2020-08-03
  Administered 2020-08-03: 21:00:00 via INTRAVENOUS

## 2020-08-03 MED ORDER — SODIUM CHLORIDE 0.9 % INJECTION
INTRAMUSCULAR | Status: AC
Start: 2020-08-03 — End: ?

## 2020-08-03 MED ORDER — FENTANYL CITRATE (PF) 50 MCG/ML IJ SOLN
50 mcg/mL | INTRAMUSCULAR | Status: DC | PRN
Start: 2020-08-03 — End: 2020-08-03
  Administered 2020-08-03: 19:00:00 via INTRAVENOUS

## 2020-08-03 MED FILL — HYDROMORPHONE 2 MG/ML INJECTION SOLUTION: 2 mg/mL | INTRAMUSCULAR | Qty: 1

## 2020-08-03 MED FILL — EPINEPHRINE (PF) 1 MG/ML INJECTION: 1 mg/mL ( mL) | INTRAMUSCULAR | Qty: 1

## 2020-08-03 MED FILL — SCOPOLAMINE (1.3-1.5) MG 72 HR TRANSDERM PATCH: 1 mg over 3 days | TRANSDERMAL | Qty: 1

## 2020-08-03 MED FILL — SODIUM CHLORIDE 0.9 % INJECTION: INTRAMUSCULAR | Qty: 20

## 2020-08-03 MED FILL — TRANEXAMIC ACID 1,000 MG/10 ML (100 MG/ML) IV: 1000 mg/10 mL (100 mg/mL) | INTRAVENOUS | Qty: 10

## 2020-08-03 MED FILL — ROPIVACAINE (PF) 5 MG/ML (0.5 %) INJECTION: 5 mg/mL (0. %) | INTRAMUSCULAR | Qty: 30

## 2020-08-03 MED FILL — MIDAZOLAM 1 MG/ML IJ SOLN: 1 mg/mL | INTRAMUSCULAR | Qty: 2

## 2020-08-03 MED FILL — CELECOXIB 200 MG CAP: 200 mg | ORAL | Qty: 1

## 2020-08-03 MED FILL — PROMETHAZINE 25 MG/ML INJECTION: 25 mg/mL | INTRAMUSCULAR | Qty: 1

## 2020-08-03 MED FILL — TYLENOL EXTRA STRENGTH 500 MG TABLET: 500 mg | ORAL | Qty: 2

## 2020-08-03 MED FILL — CEFAZOLIN 2 GRAM/20 ML IN STERILE WATER INTRAVENOUS SYRINGE: 2 gram/0 mL | INTRAVENOUS | Qty: 20

## 2020-08-03 MED FILL — APREPITANT 40 MG CAP: 40 mg | ORAL | Qty: 1

## 2020-08-03 MED FILL — FENTANYL CITRATE (PF) 50 MCG/ML IJ SOLN: 50 mcg/mL | INTRAMUSCULAR | Qty: 2

## 2020-08-03 NOTE — Care Coordination-Inpatient (Signed)
Pt received from OR on RA via stretcher. Pt medicated for pain x3 and nausea x1. Dr. Samuel Bouche at bedside, discharge instructions reviewed and questions answered. VSS. No further needs at this time.

## 2020-08-03 NOTE — Anesthesia Pre-Procedure Evaluation (Signed)
Relevant Problems   NEUROLOGY   (+) Attention deficit disorder (ADD) without hyperactivity       Anesthetic History     PONV          Review of Systems / Medical History  Patient summary reviewed and pertinent labs reviewed    Pulmonary  Within defined limits                 Neuro/Psych   Within defined limits           Cardiovascular  Within defined limits                Exercise tolerance: >4 METS     GI/Hepatic/Renal  Within defined limits              Endo/Other  Within defined limits           Other Findings   Comments: Covid + 07/03/20. Minimal symptoms. >4 weeks         Physical Exam    Airway  Mallampati: II  TM Distance: 4 - 6 cm  Neck ROM: normal range of motion   Mouth opening: Normal     Cardiovascular  Regular rate and rhythm,  S1 and S2 normal,  no murmur, click, rub, or gallop             Dental  No notable dental hx       Pulmonary  Breath sounds clear to auscultation               Abdominal         Other Findings            Anesthetic Plan    ASA: 2  Anesthesia type: general          Induction: Intravenous  Anesthetic plan and risks discussed with: Patient and Family      Discussed with surgeon. Will hold on any PNB. He will provide analgesic injection end of surgery.    Tylenol/celebrex/scop/emend/versed preop.

## 2020-08-03 NOTE — Anesthesia Post-Procedure Evaluation (Signed)
Procedure(s):  RIGHT HIP ARTHROSCOPY FEMOROPLASTY, ACETABULOPLASTY AND LABRAL REPAIR.    general    Anesthesia Post Evaluation        Patient location during evaluation: PACU  Patient participation: complete - patient participated  Level of consciousness: awake  Pain management: satisfactory to patient  Airway patency: patent  Anesthetic complications: no  Cardiovascular status: hemodynamically stable  Respiratory status: spontaneous ventilation  Hydration status: euvolemic  Post anesthesia nausea and vomiting:  none      INITIAL Post-op Vital signs:   Vitals Value Taken Time   BP 110/61 08/03/20 1649   Temp 36.9 ??C (98.5 ??F) 08/03/20 1632   Pulse 59 08/03/20 1649   Resp 16 08/03/20 1649   SpO2 96 % 08/03/20 1649

## 2020-08-03 NOTE — Interval H&P Note (Signed)
Traction Time: 8657-8469

## 2020-08-03 NOTE — Op Note (Signed)
Operative Report    Patient: Brooke Graham MRN: [id number]  SSN: [id number]    Date of Birth: [birth year]  Age: 25 y.o.  Sex: female       Date of Surgery: 08/03/2020     Preoperative Diagnosis: Femoroacetabular impingement of right hip [M25.851]  Tear of right acetabular labrum, initial encounter [S73.191A]     Postoperative Diagnosis: Femoroacetabular impingement of right hip [M25.851]  Tear of right acetabular labrum, initial encounter [S73.191A]     Surgeon(s) and Role:     * Jeb Levering, MD - Primary    Anesthesia: General     Procedure: 1) Right Hip arthroscopy with labral repair  2) RightHip arthroscopy with Cam femoroplasty  3) Right hip arthroscopy with rim trimming acetabuloplasty       Estimated Blood Loss:  10 mL    TractionTime: 46 minutes    Implants:   Implant Name Type Inv. Item Serial No. Manufacturer Lot No. LRB No. Used Action   Suture Anchor PEEK mini hip pushlock     [employer] [id number] Right 2 Implanted               Specimens: * No specimens in log *        Drains: None                Complications: None    Counts: Sponge and needle counts were correct times two.        Indications: See H & P     Intraoperative Findings: Chondral labral instability and labral tearing with over coverage of the acetabulum and a mild cam deformity.  No significant chondromalacia appreciated    Procedure in Detail: After informed consent was obtained surgical site was marked in the preoperative area by myself.  Patient brought the operating room placed supine the operating table and anesthesia was induced.  Large well-padded post was assembled as well as well-padded traction boots on bilateral lower extremities were assembled.  All bony prominences were well padded.  Traction and countertraction were applied with the hip distraction device and fluoroscopy was taken to assure adequate distraction of the operative hip joint.  Right hip was then prepped and draped in usual orthopedic sterile  fashion.  Timeout was performed per protocol and antibiotics given per protocol.    Initially a standard peritrochanteric proximal anterolateral (PALA) portal was established under fluoroscopic guidance.  Arthroscopic regulation techniques were then used to establish a modified mid anterior portal (MAP) which was established atraumatically outside of the labrum.  From that view there was synovitis and displaced labral tearing.  Arthroscope was then placed in the mid anterior portal to evaluate the placement of the PALA portal which was noted to be in good position outside of the labrum.  There was synovitis from this view but maintenance of the articular surfaces.  We visualize posteriorly on the acetabulum as well as centrally and anteriorly and there is no significant chondromalacia other than a chondral labral wave sign of the edge adjacent to the labral pathology.  Arthroscopic knife was then used to create an interportal capsulotomy going posteriorly initially and then working more anteriorly towards our portal. Hemostasis was obtained with an RF device.  We then switched the arthroscope back to the PALA portal and completed the interportal capsulotomy anteriorly using the arthroscopic knife.  RF device was then used to obtain hemostasis.  Diagnostic arthroscopy was then completed.  Inspection of the labrum revealed chondral labral instability and separation with  a labral tear which was displaced down into the joint that extended from approximately 1-3 o'clock..  RF device was used to expose the acetabular rim which revealed focal over coverage and some retroversion as became more laterally that was adjacent to the labral pathology.  Using a motorized bur the acetabular rim was resected taking 3 to 4 mm of bone addressing the over coverage and preserving the labrum for repair..  Motorized shaver was used to debride the free edge labral tearing.  We then placed a cannula in the mid anterior portal and use a  suture passing device to pass a suture tape in a day stitch mattress configuration between 2:00 and 3:00 and this was placed into a knotless push lock anchor.  We then repeated this coming more laterally placing an anchor at about the 1 o'clock position.  This rolled the labrum up out of the joint and oppose it to the acetabular rim nicely.  The edges were debrided using motorized shaver.  The labrum was probed and noted to be stable.  We switched portals and probed it from the lateral portal and viewed from anteriorly as well and I do not feel further fixation was necessary.  Traction was let off at this point 46 minutes and there was good restoration of the suction seal of the joint.    Arthroscope was placed in the mid anterior portal and the hip was flexed up. The retinacular vessels were visualized and noted to be intact.  There was a a mild cam deformity at the head neck junction that appeared to impinge upon dynamic exam.  We used an RF device to expose this. After exposure of the cam deformity motorized bur was used to establish head neck junction and then create a femoroplasty resection creating offset of the head neck junction and contouring this going distally resecting approximately 3-4 mm of bone.  Dynamic exam was confirmed which revealed no further impingement.  Multiple fluoroscopic views were taken which showed adequate restoration of offset of the head neck junction.    The hip was then placed in 20 to 30 degrees of flexion and using a suture passing device and a single portal technique three 0.9 suture tapes were placed in the intraportal capsulotomy for capsular closure.  Local injection cocktail of ropivacaine,epinephrine  and Toradol dilute with saline was injected with a small amount intra-articularly and periarticular the around the hip joint capsule for postoperative pain control.  Skin was closed with buried Monocryl suture and Steri-Strips and a sterile dressing and cryotherapy device was  applied.  Arayla tolerated the procedure well was awakened and transferred to the PACU in stable condition    Postoperative Plan  1) Discharge home  2) HO prophylaxis   3) Hip arthroscopy labral repair protocol    Signed By:  Jeb Levering, MD     August 03, 2020

## 2020-08-04 NOTE — Addendum Note (Signed)
Addendum         created 08/04/20 1419 by Raj Janus, CRNA                   Flowsheet accepted

## 2020-08-07 MED ORDER — MELOXICAM 7.5 MG TAB
7.5 mg | ORAL_TABLET | Freq: Every day | ORAL | 1 refills | Status: AC
Start: 2020-08-07 — End: ?

## 2020-08-08 ENCOUNTER — Encounter: Attending: Orthopaedic Surgery | Primary: Specialist

## 2020-08-09 ENCOUNTER — Encounter

## 2020-08-09 NOTE — Telephone Encounter (Signed)
She needs a PT order and her post op note or surgical note. She needs weight bearing status. Please fax to # [phone]

## 2020-08-09 NOTE — Telephone Encounter (Signed)
Notes, PT order, and protocol with weightbearing status faxed to the number provided.

## 2020-08-15 ENCOUNTER — Encounter

## 2020-08-15 MED ORDER — RX AMB DISABLED PLACARD
0 refills | Status: AC
Start: 2020-08-15 — End: ?

## 2020-08-16 NOTE — Telephone Encounter (Signed)
I spoke with Madelene and the fax number she provided to Dr. Claudette Laws [phone]) is not working.  She will call back to see if there is another number we can fax to.

## 2020-08-16 NOTE — Telephone Encounter (Signed)
She left a message asking for a return call from [facility]. She did not say what it was in reference to.

## 2020-08-16 NOTE — Telephone Encounter (Signed)
I spoke with Zollie Scale and she provided a new fax number for me.  Sent PT referral, Op notes, and protocol to [phone]

## 2020-08-17 ENCOUNTER — Ambulatory Visit
Admit: 2020-08-17 | Discharge: 2020-08-17 | Payer: BLUE CROSS/BLUE SHIELD | Attending: Orthopaedic Surgery | Primary: Specialist

## 2020-08-17 ENCOUNTER — Ambulatory Visit: Attending: Orthopaedic Surgery | Primary: Specialist

## 2020-08-17 DIAGNOSIS — Z09 Encounter for follow-up examination after completed treatment for conditions other than malignant neoplasm: Secondary | ICD-10-CM

## 2020-08-17 NOTE — Progress Notes (Signed)
Progress Notes by Jeb Levering, MD at 08/17/20 0845                Author: Jeb Levering, MD  Service: --  Author Type: Physician       Filed: 08/17/20 0915  Encounter Date: 08/17/2020  Status: Signed          Editor: Jeb Levering, MD (Physician)                       Name: Brooke Graham   Date of Birth: 08/12/1995   Gender: female   MRN: [id number]      Procedure Performed:    1) Right Hip arthroscopy with labral repair  2) RightHip arthroscopy with Cam femoroplasty   3) Right hip arthroscopy with rim trimming acetabuloplasty       Date of Procedure: 08/03/2020         Subjective:Returns 2-week status post hip arthroscopy doing very well with the exception of some nerve pain anteriorly in her thigh.         Physical Examination: Incisions are healing well but she has some superficial skin irritation and erythema from the Steri-Strips. Sensation  intact to light touch in the medial thigh but some dullness to sensation in the anterior thigh and anterior lateral thigh. Motor function intact throughout. She tolerates circumduction as well as flexion to 90 degrees without significant pain.         Assessment:             ICD-10-CM  ICD-9-CM          1.  S/P orthopedic surgery, follow-up exam   Z09  V67.09     2.  Femoroacetabular impingement of right hip   M25.851  719.95     3.  Tear of right acetabular labrum, subsequent encounter   S73.191D  V58.89             843.8            Plan:       Continue PT per hip arthroscopy labral repair protocol. 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 She will call if she has any wound  concerns      Follow up:      Follow-up and Dispositions      ??  Return in about 6 weeks (around 09/28/2020).
